# Patient Record
Sex: Female | Born: 1935 | Race: Black or African American | Hispanic: No | Smoking: Never smoker
Health system: Southern US, Community
[De-identification: ages and names within clinical notes are randomized; demographics above are authoritative.]

## PROBLEM LIST (undated history)

## (undated) DIAGNOSIS — I509 Heart failure, unspecified: Secondary | ICD-10-CM

## (undated) DIAGNOSIS — Z9889 Other specified postprocedural states: Secondary | ICD-10-CM

## (undated) DIAGNOSIS — I639 Cerebral infarction, unspecified: Secondary | ICD-10-CM

## (undated) DIAGNOSIS — I10 Essential (primary) hypertension: Secondary | ICD-10-CM

## (undated) HISTORY — PX: EP IMPLANTABLE DEVICE: SHX172B

---

## 2014-07-21 ENCOUNTER — Inpatient Hospital Stay (HOSPITAL_COMMUNITY): Payer: Medicare Other

## 2014-07-21 ENCOUNTER — Encounter (HOSPITAL_COMMUNITY): Payer: Self-pay

## 2014-07-21 ENCOUNTER — Inpatient Hospital Stay (HOSPITAL_COMMUNITY)
Admission: EM | Admit: 2014-07-21 | Discharge: 2014-07-23 | DRG: 603 | Disposition: A | Discharge status: Home Health | Payer: Medicare Other | Attending: Internal Medicine | Admitting: Internal Medicine

## 2014-07-21 DIAGNOSIS — M109 Gout, unspecified: Secondary | ICD-10-CM | POA: Diagnosis present

## 2014-07-21 DIAGNOSIS — J449 Chronic obstructive pulmonary disease, unspecified: Secondary | ICD-10-CM | POA: Diagnosis present

## 2014-07-21 DIAGNOSIS — R7989 Other specified abnormal findings of blood chemistry: Secondary | ICD-10-CM | POA: Diagnosis present

## 2014-07-21 DIAGNOSIS — I429 Cardiomyopathy, unspecified: Secondary | ICD-10-CM | POA: Diagnosis present

## 2014-07-21 DIAGNOSIS — F17210 Nicotine dependence, cigarettes, uncomplicated: Secondary | ICD-10-CM | POA: Diagnosis present

## 2014-07-21 DIAGNOSIS — L0390 Cellulitis, unspecified: Secondary | ICD-10-CM | POA: Diagnosis present

## 2014-07-21 DIAGNOSIS — N183 Chronic kidney disease, stage 3 (moderate): Secondary | ICD-10-CM | POA: Diagnosis present

## 2014-07-21 DIAGNOSIS — Z8673 Personal history of transient ischemic attack (TIA), and cerebral infarction without residual deficits: Secondary | ICD-10-CM

## 2014-07-21 DIAGNOSIS — I509 Heart failure, unspecified: Secondary | ICD-10-CM | POA: Diagnosis present

## 2014-07-21 DIAGNOSIS — I129 Hypertensive chronic kidney disease with stage 1 through stage 4 chronic kidney disease, or unspecified chronic kidney disease: Secondary | ICD-10-CM | POA: Diagnosis present

## 2014-07-21 DIAGNOSIS — I248 Other forms of acute ischemic heart disease: Secondary | ICD-10-CM | POA: Diagnosis present

## 2014-07-21 DIAGNOSIS — Z7982 Long term (current) use of aspirin: Secondary | ICD-10-CM

## 2014-07-21 DIAGNOSIS — E440 Moderate protein-calorie malnutrition: Secondary | ICD-10-CM | POA: Diagnosis present

## 2014-07-21 DIAGNOSIS — R748 Abnormal levels of other serum enzymes: Secondary | ICD-10-CM

## 2014-07-21 DIAGNOSIS — L03116 Cellulitis of left lower limb: Secondary | ICD-10-CM | POA: Diagnosis not present

## 2014-07-21 DIAGNOSIS — Z95810 Presence of automatic (implantable) cardiac defibrillator: Secondary | ICD-10-CM | POA: Diagnosis not present

## 2014-07-21 DIAGNOSIS — E876 Hypokalemia: Secondary | ICD-10-CM | POA: Diagnosis not present

## 2014-07-21 DIAGNOSIS — M25562 Pain in left knee: Secondary | ICD-10-CM

## 2014-07-21 DIAGNOSIS — R609 Edema, unspecified: Secondary | ICD-10-CM | POA: Diagnosis not present

## 2014-07-21 DIAGNOSIS — Z880 Allergy status to penicillin: Secondary | ICD-10-CM

## 2014-07-21 HISTORY — DX: Heart failure, unspecified: I50.9

## 2014-07-21 HISTORY — DX: Essential (primary) hypertension: I10

## 2014-07-21 LAB — CBC WITH DIFFERENTIAL/PLATELET
BASOS ABS: 0 10*3/uL (ref 0.0–0.1)
Basophils Relative: 0 % (ref 0–1)
Eosinophils Absolute: 0.1 10*3/uL (ref 0.0–0.7)
Eosinophils Relative: 1 % (ref 0–5)
HCT: 32.6 % — ABNORMAL LOW (ref 36.0–46.0)
Hemoglobin: 10.5 g/dL — ABNORMAL LOW (ref 12.0–15.0)
LYMPHS PCT: 29 % (ref 12–46)
Lymphs Abs: 1.7 10*3/uL (ref 0.7–4.0)
MCH: 23.8 pg — ABNORMAL LOW (ref 26.0–34.0)
MCHC: 32.2 g/dL (ref 30.0–36.0)
MCV: 73.9 fL — AB (ref 78.0–100.0)
Monocytes Absolute: 0.8 10*3/uL (ref 0.1–1.0)
Monocytes Relative: 14 % — ABNORMAL HIGH (ref 3–12)
Neutro Abs: 3.1 10*3/uL (ref 1.7–7.7)
Neutrophils Relative %: 56 % (ref 43–77)
PLATELETS: 260 10*3/uL (ref 150–400)
RBC: 4.41 MIL/uL (ref 3.87–5.11)
RDW: 15.4 % (ref 11.5–15.5)
WBC: 5.6 10*3/uL (ref 4.0–10.5)

## 2014-07-21 LAB — HEPATIC FUNCTION PANEL
ALT: 36 U/L — ABNORMAL HIGH (ref 0–35)
AST: 129 U/L — AB (ref 0–37)
Albumin: 2.7 g/dL — ABNORMAL LOW (ref 3.5–5.2)
Alkaline Phosphatase: 53 U/L (ref 39–117)
BILIRUBIN DIRECT: 0.4 mg/dL (ref 0.0–0.5)
BILIRUBIN INDIRECT: 0.8 mg/dL (ref 0.3–0.9)
BILIRUBIN TOTAL: 1.2 mg/dL (ref 0.3–1.2)
Total Protein: 6.3 g/dL (ref 6.0–8.3)

## 2014-07-21 LAB — I-STAT TROPONIN, ED: Troponin i, poc: 0.21 ng/mL (ref 0.00–0.08)

## 2014-07-21 LAB — CBC
HEMATOCRIT: 31.4 % — AB (ref 36.0–46.0)
Hemoglobin: 9.9 g/dL — ABNORMAL LOW (ref 12.0–15.0)
MCH: 23.3 pg — AB (ref 26.0–34.0)
MCHC: 31.5 g/dL (ref 30.0–36.0)
MCV: 74.1 fL — AB (ref 78.0–100.0)
Platelets: 239 10*3/uL (ref 150–400)
RBC: 4.24 MIL/uL (ref 3.87–5.11)
RDW: 15.5 % (ref 11.5–15.5)
WBC: 5.4 10*3/uL (ref 4.0–10.5)

## 2014-07-21 LAB — URINALYSIS, ROUTINE W REFLEX MICROSCOPIC
BILIRUBIN URINE: NEGATIVE
GLUCOSE, UA: NEGATIVE mg/dL
KETONES UR: NEGATIVE mg/dL
Leukocytes, UA: NEGATIVE
Nitrite: NEGATIVE
PROTEIN: NEGATIVE mg/dL
Specific Gravity, Urine: 1.006 (ref 1.005–1.030)
Urobilinogen, UA: 0.2 mg/dL (ref 0.0–1.0)
pH: 6.5 (ref 5.0–8.0)

## 2014-07-21 LAB — BASIC METABOLIC PANEL
ANION GAP: 8 (ref 5–15)
BUN: 18 mg/dL (ref 6–23)
CHLORIDE: 100 mmol/L (ref 96–112)
CO2: 28 mmol/L (ref 19–32)
Calcium: 8.9 mg/dL (ref 8.4–10.5)
Creatinine, Ser: 1.78 mg/dL — ABNORMAL HIGH (ref 0.50–1.10)
GFR calc Af Amer: 30 mL/min — ABNORMAL LOW (ref >90)
GFR calc non Af Amer: 26 mL/min — ABNORMAL LOW (ref >90)
Glucose, Bld: 107 mg/dL — ABNORMAL HIGH (ref 70–99)
POTASSIUM: 3 mmol/L — AB (ref 3.5–5.1)
Sodium: 136 mmol/L (ref 135–145)

## 2014-07-21 LAB — CREATININE, SERUM
CREATININE: 1.79 mg/dL — AB (ref 0.50–1.10)
GFR calc Af Amer: 29 mL/min — ABNORMAL LOW (ref >90)
GFR, EST NON AFRICAN AMERICAN: 25 mL/min — AB (ref >90)

## 2014-07-21 LAB — TSH: TSH: 2.253 u[IU]/mL (ref 0.350–4.500)

## 2014-07-21 LAB — URINE MICROSCOPIC-ADD ON

## 2014-07-21 LAB — MAGNESIUM: MAGNESIUM: 1.5 mg/dL (ref 1.5–2.5)

## 2014-07-21 LAB — TROPONIN I: Troponin I: 0.42 ng/mL — ABNORMAL HIGH (ref <0.031)

## 2014-07-21 LAB — BRAIN NATRIURETIC PEPTIDE
B Natriuretic Peptide: 402.1 pg/mL — ABNORMAL HIGH (ref 0.0–100.0)
B Natriuretic Peptide: 355.3 pg/mL — ABNORMAL HIGH (ref 0.0–100.0)

## 2014-07-21 MED ORDER — ACETAMINOPHEN 650 MG RE SUPP: 650.0000 mg | Freq: Four times a day (QID) | RECTAL | Status: DC | PRN

## 2014-07-21 MED ORDER — POTASSIUM CHLORIDE ER 10 MEQ PO TBCR
40.0000 meq | EXTENDED_RELEASE_TABLET | Freq: Once | ORAL | Status: AC
  Administered 2014-07-21: 40 meq via ORAL
  Filled 2014-07-21: qty 4

## 2014-07-21 MED ORDER — BUDESONIDE-FORMOTEROL FUMARATE 160-4.5 MCG/ACT IN AERO
2.0000 | INHALATION_SPRAY | Freq: Two times a day (BID) | RESPIRATORY_TRACT | Status: DC
  Administered 2014-07-22 – 2014-07-23 (×3): 2 via RESPIRATORY_TRACT
  Filled 2014-07-21 (×2): qty 6

## 2014-07-21 MED ORDER — LEVALBUTEROL HCL 0.63 MG/3ML IN NEBU
0.6300 mg | INHALATION_SOLUTION | Freq: Four times a day (QID) | RESPIRATORY_TRACT | Status: DC
  Administered 2014-07-21 – 2014-07-22 (×2): 0.63 mg via RESPIRATORY_TRACT
  Filled 2014-07-21 (×2): qty 3

## 2014-07-21 MED ORDER — FUROSEMIDE 10 MG/ML IJ SOLN
40.0000 mg | Freq: Once | INTRAMUSCULAR | Status: AC
  Administered 2014-07-21: 40 mg via INTRAVENOUS
  Filled 2014-07-21: qty 4

## 2014-07-21 MED ORDER — METHYLPREDNISOLONE SODIUM SUCC 125 MG IJ SOLR
60.0000 mg | Freq: Every day | INTRAMUSCULAR | Status: DC
  Administered 2014-07-21 – 2014-07-23 (×3): 60 mg via INTRAVENOUS
  Filled 2014-07-21: qty 0.96
  Filled 2014-07-21: qty 2
  Filled 2014-07-21: qty 0.96

## 2014-07-21 MED ORDER — ONDANSETRON HCL 4 MG/2ML IJ SOLN: 4.0000 mg | Freq: Four times a day (QID) | INTRAMUSCULAR | Status: DC | PRN

## 2014-07-21 MED ORDER — ASPIRIN EC 81 MG PO TBEC
81.0000 mg | DELAYED_RELEASE_TABLET | Freq: Every day | ORAL | Status: DC
  Administered 2014-07-22 – 2014-07-23 (×2): 81 mg via ORAL
  Filled 2014-07-21 (×3): qty 1

## 2014-07-21 MED ORDER — SODIUM CHLORIDE 0.9 % IV SOLN: 250.0000 mL | INTRAVENOUS | Status: DC | PRN

## 2014-07-21 MED ORDER — CARVEDILOL 25 MG PO TABS
25.0000 mg | ORAL_TABLET | Freq: Two times a day (BID) | ORAL | Status: DC
  Administered 2014-07-22 – 2014-07-23 (×3): 25 mg via ORAL
  Filled 2014-07-21 (×6): qty 1

## 2014-07-21 MED ORDER — ONDANSETRON HCL 4 MG PO TABS: 4.0000 mg | ORAL_TABLET | Freq: Four times a day (QID) | ORAL | Status: DC | PRN

## 2014-07-21 MED ORDER — ISOSORBIDE MONONITRATE ER 30 MG PO TB24
30.0000 mg | ORAL_TABLET | Freq: Every day | ORAL | Status: DC
  Administered 2014-07-22 – 2014-07-23 (×2): 30 mg via ORAL
  Filled 2014-07-21 (×3): qty 1

## 2014-07-21 MED ORDER — SODIUM CHLORIDE 0.9 % IJ SOLN
3.0000 mL | Freq: Two times a day (BID) | INTRAMUSCULAR | Status: DC
  Administered 2014-07-22 (×2): 3 mL via INTRAVENOUS

## 2014-07-21 MED ORDER — VANCOMYCIN HCL 500 MG IV SOLR
500.0000 mg | INTRAVENOUS | Status: DC
  Administered 2014-07-22: 500 mg via INTRAVENOUS
  Filled 2014-07-21 (×2): qty 500

## 2014-07-21 MED ORDER — MAGNESIUM OXIDE 400 (241.3 MG) MG PO TABS
400.0000 mg | ORAL_TABLET | Freq: Every day | ORAL | Status: DC
  Administered 2014-07-21 – 2014-07-23 (×3): 400 mg via ORAL
  Filled 2014-07-21 (×3): qty 1

## 2014-07-21 MED ORDER — SODIUM CHLORIDE 0.9 % IJ SOLN: 3.0000 mL | INTRAMUSCULAR | Status: DC | PRN

## 2014-07-21 MED ORDER — ACETAMINOPHEN 325 MG PO TABS
650.0000 mg | ORAL_TABLET | Freq: Four times a day (QID) | ORAL | Status: DC | PRN
  Administered 2014-07-22 (×3): 650 mg via ORAL
  Filled 2014-07-21 (×3): qty 2

## 2014-07-21 MED ORDER — CLINDAMYCIN PHOSPHATE 600 MG/50ML IV SOLN
600.0000 mg | Freq: Once | INTRAVENOUS | Status: AC
Start: 2014-07-21 — End: 2014-07-21
  Administered 2014-07-21: 600 mg via INTRAVENOUS
  Filled 2014-07-21: qty 50

## 2014-07-21 MED ORDER — POTASSIUM CHLORIDE ER 10 MEQ PO TBCR
40.0000 meq | EXTENDED_RELEASE_TABLET | Freq: Once | ORAL | Status: AC
  Administered 2014-07-21: 40 meq via ORAL

## 2014-07-21 MED ORDER — ENOXAPARIN SODIUM 30 MG/0.3ML ~~LOC~~ SOLN
30.0000 mg | SUBCUTANEOUS | Status: DC
  Administered 2014-07-21 – 2014-07-22 (×2): 30 mg via SUBCUTANEOUS
  Filled 2014-07-21 (×3): qty 0.3

## 2014-07-21 MED ORDER — IPRATROPIUM BROMIDE 0.02 % IN SOLN
0.5000 mg | Freq: Four times a day (QID) | RESPIRATORY_TRACT | Status: DC
  Administered 2014-07-21 – 2014-07-22 (×2): 0.5 mg via RESPIRATORY_TRACT
  Filled 2014-07-21 (×2): qty 2.5

## 2014-07-21 MED ORDER — VANCOMYCIN HCL IN DEXTROSE 1-5 GM/200ML-% IV SOLN
1000.0000 mg | Freq: Once | INTRAVENOUS | Status: AC
  Administered 2014-07-21: 1000 mg via INTRAVENOUS
  Filled 2014-07-21: qty 200

## 2014-07-21 MED ORDER — SODIUM CHLORIDE 0.9 % IV SOLN: INTRAVENOUS | Status: DC

## 2014-07-21 MED ORDER — SODIUM CHLORIDE 0.9 % IJ SOLN: 3.0000 mL | Freq: Two times a day (BID) | INTRAMUSCULAR | Status: DC

## 2014-07-21 NOTE — ED Notes (Signed)
PA at bedside.

## 2014-07-21 NOTE — ED Notes (Signed)
Gentry, MD notified of abnormal lab test results 

## 2014-07-21 NOTE — ED Notes (Signed)
Second IV team nurse at bedside.  

## 2014-07-21 NOTE — ED Notes (Signed)
Pt c/o L hand and bilateral leg swelling; family member at bedside reports patient has been out of medications x 1.5 week since moving to [place]. Pt does not have a pcp in this area yet.

## 2014-07-21 NOTE — Progress Notes (Signed)
ANTIBIOTIC CONSULT NOTE - INITIAL  Pharmacy Consult for Vancomycin Indication: Cellulitis  Allergies  Allergen Reactions  . Penicillins Hives    Patient Measurements: Height: 5\' 9"  (175.3 cm) Weight: 150 lb (68.04 kg) IBW/kg (Calculated) : 66.2  Vital Signs: Temp: 97.3 F (36.3 C) (02/19 1250) Temp Source: Oral (02/19 1250) BP: 182/83 mmHg (02/19 1715) Pulse Rate: 116 (02/19 1715) Intake/Output from previous day:   Intake/Output from this shift:    Labs:  Recent Labs  07/21/14 1417  WBC 5.6  HGB 10.5*  PLT 260  CREATININE 1.78*   Estimated Creatinine Clearance: 25.9 mL/min (by C-G formula based on Cr of 1.78). No results for input(s): VANCOTROUGH, VANCOPEAK, VANCORANDOM, GENTTROUGH, GENTPEAK, GENTRANDOM, TOBRATROUGH, TOBRAPEAK, TOBRARND, AMIKACINPEAK, AMIKACINTROU, AMIKACIN in the last 72 hours.   Microbiology: No results found for this or any previous visit (from the past 720 hour(s)).  Medical History: Past Medical History  Diagnosis Date  . CHF (congestive heart failure)   . Hypertension     Medications:   (Not in a hospital admission)   Assessment: 81 yo F presents on 2/19 with leg swelling and pain. PMH includes history of stroke, CHF, HTN, and COPD. Pharmacy to dose vancomycin for cellulitis.Pt is afebrile and WBC wnl. SCr 1.78, CrCl ~4325ml/min.  Goal of Therapy:  Vancomycin trough level 10-15 mcg/ml  Resolution of infection  Plan:  Start vancomycin 500mg  IV Q24 Monitor renal function, clinical picture F/U abx deescalation and LOT  Katherine Chapman J 07/21/2014,5:41 PM

## 2014-07-21 NOTE — ED Notes (Signed)
IV attempt x2 without success.

## 2014-07-21 NOTE — ED Notes (Signed)
IV team at bedside 

## 2014-07-21 NOTE — ED Provider Notes (Signed)
CSN: [id number]     Arrival date & time 07/21/14  1238 History   First MD Initiated Contact with Patient 07/21/14 1336     Chief Complaint  Patient presents with  . Leg Swelling     (Consider location/radiation/quality/duration/timing/severity/associated sxs/prior Treatment) HPI   This is an 81 year old female who presents emergency Department with chief complaint of left leg swelling and pain. She has a past medical history of previous stroke, CHF, hypertension, COPD,. Patient is a current smoker. [place] is living with her son. He says she has a past history of gout and chronic peripheral edema. The patient was seen by a neurologist here in town and had her medications refilled. She was told to double up on her Lasix. On days when the swelling in her legs is bad. Her son states that he has been giving her Lasix for her leg pain. However, she has only decreased swelling in the right and has continued to have a unilateral left leg swelling and pain. He states he first noticed the difference about 2 days ago when he was trying to help her move the legs. She had significant pain in the left side. She denies a history of DVT. However, the patient seems to have some cognitive deficit and her history is unreliable. The patient also has chronic left hand swelling but denies any pain or unilateral weakness. She is able to ambulate with a walker or a cane at home. Her son states she has been afebrile and does not appear to be short of breath.  Past Medical History  Diagnosis Date  . CHF (congestive heart failure)   . Hypertension    Past Surgical History  Procedure Laterality Date  . Ep implantable device     No family history on file. History  Substance Use Topics  . Smoking status: Current Some Day Smoker  . Smokeless tobacco: Not on file  . Alcohol Use: No   OB History    No data available     Review of Systems  Unable to perform ROS: Dementia       Allergies  Penicillins  Home Medications   Prior to Admission medications   Not on File   BP 131/78 mmHg  Pulse 75  Temp(Src) 97.3 F (36.3 C) (Oral)  Resp 11  SpO2 100% Physical Exam  Constitutional: She is oriented to person, place, and time. She appears well-developed and well-nourished. No distress.  Patient sitting upright in bed, leaning to the left side.  HENT:  Head: Normocephalic and atraumatic.  Large well-healed scar on the right cheek  Eyes: Conjunctivae are normal. No scleral icterus.  Neck: Normal range of motion. No JVD present.  Cardiovascular: Normal rate, regular rhythm and normal heart sounds.  Exam reveals no gallop and no friction rub.   No murmur heard. BL LOWER extremity edema. Left greater than R.. Warm, red, ttp. Pulses present on doppler.  Pulmonary/Chest: Effort normal and breath sounds normal. No respiratory distress.  Abdominal: Soft. Bowel sounds are normal. She exhibits no distension and no mass. There is no tenderness. There is no guarding.  Neurological: She is alert and oriented to person, place, and time.  Skin: Skin is warm and dry. She is not diaphoretic.  Nursing note and vitals reviewed.   ED Course  Procedures (including critical care time) Labs Review Labs Reviewed - No data to display  Imaging Review No results found.   EKG Interpretation   Date/Time:  Friday  July 21 2014 15:40:59 EST Ventricular Rate:  73 PR Interval:  143 QRS Duration: 89 QT Interval:  503 QTC Calculation: 554 R Axis:   13 Text Interpretation:  Sinus rhythm Atrial premature complex Abnrm T,  consider ischemia, anterolateral lds Prolonged QT interval No old tracing  to compare Confirmed by Mirian Mo [phone]) on 07/21/2014 4:59:40 PM      MDM   Final diagnoses:  Cellulitis of left lower extremity  Elevated serum creatinine    2:52 PM BP 145/75 mmHg  Pulse 76  Temp(Src) 97.3 F (36.3 C) (Oral)  Resp 13  SpO2 100%; Patient  with UL leg swelling. I do not have access to previous medical records and the patient has an extensive medical history. Differential includes dvt, cellulitis , chf, renal insufficiency, . EKG with abnormla T waves in anterolateral leads,  I have discussed the case with Dr, Susie Cassette who will admit and assume care of the patient  Pt stable in ED with no significant deterioration in condition.     5:01 PM Blood pressure 142/67, pulse 77, temperature 97.3 F (36.3 C), temperature source Oral, resp. rate 13, SpO2 100 %.  Patient with elevated serum creatinine.  Unknown baseline. Apparent R leg cellulits and negative DVT study. troponin si elevated but no c/o cp in the setting of her kidney fx and chronic illnesses I doubt MI as the cause  Arthor Captain, PA-C 07/24/14 1153  Harrold Donath R. Rubin Payor, MD 07/25/14 [phone]

## 2014-07-21 NOTE — Progress Notes (Signed)
*  PRELIMINARY RESULTS* Vascular Ultrasound Left lower extremity venous duplex has been completed.  Preliminary findings: no evidence of DVT.   Farrel DemarkJill Eunice, RDMS, RVT  07/21/2014, 2:54 PM

## 2014-07-21 NOTE — H&P (Signed)
[facility] History and Physical  Persais Ethridge ZOX:[id number] DOB: April 12, 1931 DOA: 07/21/2014  Referring physician:  PCP: No primary care provider on file.   Chief Complaint:  Generalized swelling  HPI:  81 year old female who apparently has an ICD, history of CHF, CVA, hypertension, COPD, active smoker, recently moved to [place] from [place], followed by a nephrologist and her PCP, presents to the ED with generalized swelling, mainly in her left upper extremity and left lower extremity. Patient apparently ran out of 4 different medications but has been taking her Lasix. Despite taking the Lasix the patient has had worsening swelling and increasing pain in bilateral lower extremities. The left lower extremities warm to touch. Patient has difficulty standing and weightbearing.No apparent fever at home. She denies falling, denies any trauma. The patient also has had swelling of the left upper extremity appears to have some residual weakness from her previous stroke on the left side. Patient is normally able to ambulate with a walker or a cane. She denies any chest pain any shortness of breath. Patient has a history of gout but ran out of her allopurinol. She has a history of hypertension and ran out of her Coreg,imdur, zestoreic . Workup in the ED shows that she is afebrile hemodynamically stable , troponin 0.4, proBNP 355, creatinine 1.79, GFR 25, AST 129, ALT 36, WBC count 5.4      Review of Systems: negative for the following  Constitutional: Denies fever, chills, diaphoresis, appetite change and fatigue.  HEENT: Denies photophobia, eye pain, redness, hearing loss, ear pain, congestion, sore throat, rhinorrhea, sneezing, mouth sores, trouble swallowing, neck pain, neck stiffness and tinnitus.  Respiratory: Denies SOB, DOE, cough, chest tightness, and wheezing.  Cardiovascular: Denies chest pain, palpitations and leg swelling.  Gastrointestinal: Denies nausea, vomiting, abdominal pain,  diarrhea, constipation, blood in stool and abdominal distention.  Genitourinary: Denies dysuria, urgency, frequency, hematuria, flank pain and difficulty urinating.  Musculoskeletal: Denies myalgias, back pain, left upper and left lower extremity swelling, arthralgias and gait problem.  Skin: Denies pallor, rash and wound.  Neurological: Denies dizziness, seizures, syncope, weakness, light-headedness, numbness and headaches.  Hematological: Denies adenopathy. Easy bruising, personal or family bleeding history  Psychiatric/Behavioral: Denies suicidal ideation, mood changes, confusion, nervousness, sleep disturbance and agitation       Past Medical History  Diagnosis Date  . CHF (congestive heart failure)   . Hypertension      Past Surgical History  Procedure Laterality Date  . Ep implantable device        Social History:  reports that she has been smoking.  She does not have any smokeless tobacco history on file. She reports that she does not drink alcohol or use illicit drugs.    Allergies  Allergen Reactions  . Penicillins Hives        FAMILY HISTORY  When questioned  Directly-patient reports  No family history of HTN, CVA ,DIABETES, TB, Cancer CAD, Bleeding Disorders, Sickle Cell, diabetes, anemia, asthma,   Prior to Admission medications   Medication Sig Start Date End Date Taking? Authorizing Provider  allopurinol (ZYLOPRIM) 100 MG tablet Take 100 mg by mouth daily.   Yes Historical Provider, MD  aspirin EC 81 MG tablet Take 81 mg by mouth daily.   Yes Historical Provider, MD  budesonide-formoterol (SYMBICORT) 160-4.5 MCG/ACT inhaler Inhale 2 puffs into the lungs 2 (two) times daily.   Yes Historical Provider, MD  carvedilol (COREG) 25 MG tablet Take 25 mg by mouth 2 (two) times daily with a  meal.   Yes Historical Provider, MD  furosemide (LASIX) 40 MG tablet Take 40 mg by mouth daily.   Yes Historical Provider, MD  isosorbide mononitrate (IMDUR) 30 MG 24 hr tablet  Take 30 mg by mouth daily.   Yes Historical Provider, MD  lisinopril-hydrochlorothiazide (PRINZIDE,ZESTORETIC) 20-12.5 MG per tablet Take 1 tablet by mouth daily.   Yes Historical Provider, MD     Physical Exam: Filed Vitals:   07/21/14 1715 07/21/14 1816 07/21/14 1818 07/21/14 1821  BP: 182/83   164/79  Pulse: 116   112  Temp:   97.2 F (36.2 C)   TempSrc:   Oral   Resp: 11     Height: 5\' 9"  (1.753 m)     Weight: 68.04 kg (150 lb) 65.953 kg (145 lb 6.4 oz)    SpO2: 100%   100%     Constitutional: Vital signs reviewed. Patient is a well-developed and well-nourished in no acute distress and cooperative with exam. Alert and oriented x3.  Head: Normocephalic and atraumatic  Ear: TM normal bilaterally  Mouth: no erythema or exudates, MMM Large well-healed scar on the right cheek  Eyes: PERRL, EOMI, conjunctivae normal, No scleral icterus.  Neck: Supple, Trachea midline normal ROM, No JVD, mass, thyromegaly, or carotid bruit present.  Cardiovascular: RRR, S1 normal, S2 normal, no MRG, pulses symmetric and intact bilaterally  Pulmonary/Chest: CTAB, no wheezes, rales, or rhonchi  Abdominal: Soft. Non-tender, non-distended, bowel sounds are normal, no masses, organomegaly, or guarding present.  GU: no CVA tenderness Musculoskeletal: No joint deformities, erythema, or stiffness, ROM full and no nontender BL LOWER extremity edema. Left greater than R.. Warm, red, ttp. Pulses present on doppler. Hematology: no cervical, inginal, or axillary adenopathy.  Neurological: A&O x3, Strenght is normal and symmetric bilaterally, cranial nerve II-XII are grossly intact, no focal motor deficit, sensory intact to light touch bilaterally.  Skin: Warm, dry and intact. No rash, cyanosis, or clubbing.  Psychiatric: Normal mood and affect. speech and behavior is normal. Judgment and thought content normal. Cognition and memory are normal.       Labs on Admission:    Basic Metabolic Panel:  Recent  Labs Lab 07/21/14 1417 07/21/14 1703  NA 136  --   K 3.0*  --   CL 100  --   CO2 28  --   GLUCOSE 107*  --   BUN 18  --   CREATININE 1.78* 1.79*  CALCIUM 8.9  --   MG  --  1.5   Liver Function Tests:  Recent Labs Lab 07/21/14 1703  AST 129*  ALT 36*  ALKPHOS 53  BILITOT 1.2  PROT 6.3  ALBUMIN 2.7*   No results for input(s): LIPASE, AMYLASE in the last 168 hours. No results for input(s): AMMONIA in the last 168 hours. CBC:  Recent Labs Lab 07/21/14 1417 07/21/14 1727  WBC 5.6 5.4  NEUTROABS 3.1  --   HGB 10.5* 9.9*  HCT 32.6* 31.4*  MCV 73.9* 74.1*  PLT 260 239   Cardiac Enzymes:  Recent Labs Lab 07/21/14 1703  TROPONINI 0.42*    BNP (last 3 results)  Recent Labs  07/21/14 1417 07/21/14 1727  BNP 402.1* 355.3*    ProBNP (last 3 results) No results for input(s): PROBNP in the last 8760 hours.     CBG: No results for input(s): GLUCAP in the last 168 hours.  Radiological Exams on Admission: No results found.  EKG: Independently reviewed.    Assessment/Plan Active Problems:  Cellulitis  Generalized swelling Patient has been taking her Lasix and appears euvolemic Worsening edema and warmth of the left upper left lower extremity could be secondary to gout It also appears that the patient has some dependent edema because of left upper extremity weakness vs hypoalbuminemia Continue Lasix  Cellulitis of the left lower extremity/swelling of left upper extremity Could be secondary to acute gout exacerbation Check uric acid Start the patient on Solu-Medrol Given chronic kidney disease would not use colchicine Start the patient on vancomycin Obtain plain films of the left upper/ left lower extremity I do not suspect septic arthritis Venous Doppler of the lower extremities negative We'll obtain a venous Doppler of bilateral upper extremities  CKD, stage III Baseline creatinine unknown Continue Lasix as the patient appears  euvolemic  Hypokalemia Replete  Hypomagnesemia Replete  Abnormal troponin Continue aspirin, Coreg 2-D echo, if the troponin is trending up will have cardiology evaluate in the morning Patient is chest pain-free , has an ICD in place most likely secondary to cardiomyopathy  Moderate protein calorie malnutrition Nutrition consult  Code Status:   full Family Communication: bedside Disposition Plan: admit   Time spent: 70 mins   [facility] Pager [phone]  If 7PM-7AM, please contact night-coverage [email or web address] Password [facility] 07/21/2014, 6:30 PM

## 2014-07-21 NOTE — ED Notes (Signed)
One set of blood cultures obtained prior to antibiotics; unable to obtain second set

## 2014-07-21 NOTE — ED Notes (Signed)
IV team unsuccessful at IV start

## 2014-07-21 NOTE — ED Notes (Signed)
Pt. Is from home. Complaint of L leg edema and L hand edema. Pt. Has some swelling to R leg as well. Pt. States L leg is painful to touch, has not been able to ambulate today. PTAR reports leg is warm to touch. Pt. Has hx CHF with ICD placement. Pt. Is recently relocated to area from [place] and has no had medications for unknown time. Pt. Takes lasix, lisinopril, HCTZ, carvedilol, ASA, and isosorbide.

## 2014-07-22 DIAGNOSIS — M7989 Other specified soft tissue disorders: Secondary | ICD-10-CM

## 2014-07-22 LAB — TROPONIN I
TROPONIN I: 0.33 ng/mL — AB (ref <0.031)
TROPONIN I: 0.27 ng/mL — AB (ref <0.031)

## 2014-07-22 LAB — URIC ACID: Uric Acid, Serum: 6.7 mg/dL (ref 2.4–7.0)

## 2014-07-22 LAB — COMPREHENSIVE METABOLIC PANEL
ALT: 32 U/L (ref 0–35)
ANION GAP: 9 (ref 5–15)
AST: 102 U/L — ABNORMAL HIGH (ref 0–37)
Albumin: 2.8 g/dL — ABNORMAL LOW (ref 3.5–5.2)
Alkaline Phosphatase: 59 U/L (ref 39–117)
BUN: 20 mg/dL (ref 6–23)
CO2: 26 mmol/L (ref 19–32)
Calcium: 9.2 mg/dL (ref 8.4–10.5)
Chloride: 103 mmol/L (ref 96–112)
Creatinine, Ser: 1.85 mg/dL — ABNORMAL HIGH (ref 0.50–1.10)
GFR calc Af Amer: 28 mL/min — ABNORMAL LOW (ref >90)
GFR calc non Af Amer: 24 mL/min — ABNORMAL LOW (ref >90)
Glucose, Bld: 140 mg/dL — ABNORMAL HIGH (ref 70–99)
Potassium: 3.5 mmol/L (ref 3.5–5.1)
Sodium: 138 mmol/L (ref 135–145)
TOTAL PROTEIN: 7 g/dL (ref 6.0–8.3)
Total Bilirubin: 0.4 mg/dL (ref 0.3–1.2)

## 2014-07-22 LAB — CBC
HCT: 33 % — ABNORMAL LOW (ref 36.0–46.0)
Hemoglobin: 10.2 g/dL — ABNORMAL LOW (ref 12.0–15.0)
MCH: 22.9 pg — ABNORMAL LOW (ref 26.0–34.0)
MCHC: 30.9 g/dL (ref 30.0–36.0)
MCV: 74 fL — ABNORMAL LOW (ref 78.0–100.0)
PLATELETS: 263 10*3/uL (ref 150–400)
RBC: 4.46 MIL/uL (ref 3.87–5.11)
RDW: 15.5 % (ref 11.5–15.5)
WBC: 4 10*3/uL (ref 4.0–10.5)

## 2014-07-22 LAB — HEMOGLOBIN A1C
Hgb A1c MFr Bld: 6 % — ABNORMAL HIGH (ref 4.8–5.6)
Mean Plasma Glucose: 126 mg/dL

## 2014-07-22 MED ORDER — LEVALBUTEROL HCL 0.63 MG/3ML IN NEBU: 0.6300 mg | INHALATION_SOLUTION | Freq: Four times a day (QID) | RESPIRATORY_TRACT | Status: DC | PRN

## 2014-07-22 MED ORDER — CETYLPYRIDINIUM CHLORIDE 0.05 % MT LIQD
7.0000 mL | Freq: Two times a day (BID) | OROMUCOSAL | Status: DC
  Administered 2014-07-22 (×2): 7 mL via OROMUCOSAL

## 2014-07-22 MED ORDER — IPRATROPIUM BROMIDE 0.02 % IN SOLN: 0.5000 mg | Freq: Four times a day (QID) | RESPIRATORY_TRACT | Status: DC | PRN

## 2014-07-22 NOTE — Progress Notes (Signed)
Katherine Chapman notified of pt troponin 0.33. Previous 0.42.

## 2014-07-22 NOTE — Progress Notes (Signed)
[facility] PROGRESS NOTE  Mozella Rexrode ZOX:[id number] DOB: 13-Aug-1930 DOA: 07/21/2014 PCP: No primary care provider on file.  Assessment/Plan: Active Problems:   Cellulitis   Generalized swelling Continue Lasix on a daily basis, Worsening edema and warmth of the left upper left lower extremity could be secondary to gout It also appears that the patient has some dependent edema because of left upper extremity weakness vs hypoalbuminemia Nutrition consult for moderate protein calorie malnutrition  Abnormal troponin, likely demand ischemia 2-D echo pending to rule out wall motion abnormalities Continue Coreg, Isosorbide and aspirin ICD in place most likely secondary to cardiomyopathy and CK D   Cellulitis vs acute gout exacerbation of the left lower extremity/swelling of left upper extremity Uric acid 6.7 Continue Solu-Medrol 2 more days Given chronic kidney disease would not use colchicine Continue vancomycin Reviewed plain films of the left upper/ left lower extremity and they are negative I do not suspect septic arthritis Venous Doppler of the lower extremities negative Pending venous Doppler of bilateral upper extremities  CKD, stage III Baseline creatinine unknown Continue Lasix as the patient appears euvolemic Creatinine 1.78> 1.85  Hypokalemia Repleted  Hypomagnesemia Replete   Moderate protein calorie malnutrition Nutrition consult   Code Status: full Family Communication: family updated about patient's clinical progress Disposition Plan:  PT/OT eval  Brief narrative: 81 year old female who apparently has an ICD, history of CHF, CVA, hypertension, COPD, active smoker, recently moved to [place] from [place], followed by a nephrologist and her PCP, presents to the ED with generalized swelling, mainly in her left upper extremity and left lower extremity. Patient apparently ran out of 4 different medications but has been taking her Lasix. Despite taking the  Lasix the patient has had worsening swelling and increasing pain in bilateral lower extremities. The left lower extremities warm to touch. Patient has difficulty standing and weightbearing.No apparent fever at home. She denies falling, denies any trauma. The patient also has had swelling of the left upper extremity appears to have some residual weakness from her previous stroke on the left side. Patient is normally able to ambulate with a walker or a cane. She denies any chest pain any shortness of breath. Patient has a history of gout but ran out of her allopurinol. She has a history of hypertension and ran out of her Coreg,imdur, zestoreic . Workup in the ED shows that she is afebrile hemodynamically stable , troponin 0.4, proBNP 355, creatinine 1.79, GFR 25, AST 129, ALT 36, WBC count 5.4  Consultants:  None  Procedures:  None  Antibiotics: Vancomycin  HPI/Subjective: Patient feels a lot better, less pain in her left leg, less pain in her left upper extremity  Objective: Filed Vitals:   07/21/14 2045 07/21/14 2129 07/22/14 0245 07/22/14 0542  BP:  150/83  145/67  Pulse: 100 88 84 79  Temp:  97.7 F (36.5 C)  98 F (36.7 C)  TempSrc:  Oral  Oral  Resp: Height:      Weight:    66.316 kg (146 lb 3.2 oz)  SpO2: 100% 100% 100% 100%    Intake/Output Summary (Last 24 hours) at 07/22/14 1108 Last data filed at 07/22/14 0955  Gross per 24 hour  Intake    800 ml  Output      0 ml  Net    800 ml    Exam:  General: No acute respiratory distress Lungs: Clear to auscultation bilaterally without wheezes or crackles Cardiovascular: Regular rate and rhythm  without murmur gallop or rub normal S1 and S2 Abdomen: Nontender, nondistended, soft, bowel sounds positive, no rebound, no ascites, no appreciable mass Extremities: No significant cyanosis, clubbing, 1+ pitting edema bilateral lower extremities      Data Reviewed: Basic Metabolic Panel:  Recent Labs Lab  07/21/14 1417 07/21/14 1703 07/22/14 0540  NA 136  --  138  K 3.0*  --  3.5  CL 100  --  103  CO2 28  --  26  GLUCOSE 107*  --  140*  BUN 18  --  20  CREATININE 1.78* 1.79* 1.85*  CALCIUM 8.9  --  9.2  MG  --  1.5  --     Liver Function Tests:  Recent Labs Lab 07/21/14 1703 07/22/14 0540  AST 129* 102*  ALT 36* 32  ALKPHOS 53 59  BILITOT 1.2 0.4  PROT 6.3 7.0  ALBUMIN 2.7* 2.8*   No results for input(s): LIPASE, AMYLASE in the last 168 hours. No results for input(s): AMMONIA in the last 168 hours.  CBC:  Recent Labs Lab 07/21/14 1417 07/21/14 1727 07/22/14 0540  WBC 5.6 5.4 4.0  NEUTROABS 3.1  --   --   HGB 10.5* 9.9* 10.2*  HCT 32.6* 31.4* 33.0*  MCV 73.9* 74.1* 74.0*  PLT 260 239 263    Cardiac Enzymes:  Recent Labs Lab 07/21/14 1703 07/21/14 2322 07/22/14 0540  TROPONINI 0.42* 0.33* 0.27*   BNP (last 3 results)  Recent Labs  07/21/14 1417 07/21/14 1727  BNP 402.1* 355.3*    ProBNP (last 3 results) No results for input(s): PROBNP in the last 8760 hours.    CBG: No results for input(s): GLUCAP in the last 168 hours.  Recent Results (from the past 240 hour(s))  Culture, blood (routine x 2)     Status: None (Preliminary result)   Collection Time: 07/21/14  8:32 PM  Result Value Ref Range Status   Specimen Description BLOOD LEFT HAND  Final   Special Requests BOTTLES DRAWN AEROBIC ONLY 7CC  Final   Culture PENDING  Incomplete   Report Status PENDING  Incomplete     Studies: Dg Knee 1-2 Views Left  07/21/2014   CLINICAL DATA:  Nontraumatic left knee pain for 2 weeks  EXAM: LEFT KNEE - 1-2 VIEW  COMPARISON:  None.  FINDINGS: There is no evidence of fracture, dislocation, or joint effusion. There is no evidence of arthropathy or other focal bone abnormality. Soft tissues are unremarkable.  IMPRESSION: Negative.   Electronically Signed   By: Ellery Plunkaniel R Mitchell M.D.   On: 07/21/2014 23:05   Dg Hand Complete Left  07/21/2014   CLINICAL  DATA:  Nontraumatic left hand pain for 2 weeks  EXAM: LEFT HAND - COMPLETE 3+ VIEW  COMPARISON:  None.  FINDINGS: There is no evidence of fracture or dislocation. There is no focal bone abnormality. Moderate arthritic changes are present at the carpometacarpal articulations. There is a dorsal carpal boss. Soft tissues are unremarkable.  IMPRESSION: Moderate arthritic changes. Negative for fracture or other acute abnormality.   Electronically Signed   By: Ellery Plunkaniel R Mitchell M.D.   On: 07/21/2014 23:17    Scheduled Meds: . antiseptic oral rinse  7 mL Mouth Rinse BID  . aspirin EC  81 mg Oral Daily  . budesonide-formoterol  2 puff Inhalation BID  . carvedilol  25 mg Oral BID WC  . enoxaparin (LOVENOX) injection  30 mg Subcutaneous Q24H  . isosorbide mononitrate  30 mg Oral Daily  .  magnesium oxide  400 mg Oral Daily  . methylPREDNISolone (SOLU-MEDROL) injection  60 mg Intravenous Daily  . sodium chloride  3 mL Intravenous Q12H  . sodium chloride  3 mL Intravenous Q12H  . vancomycin  500 mg Intravenous Q24H   Continuous Infusions:   Active Problems:   Cellulitis    Time spent: 40 minutes   [facility]  [facility] Pager [phone]. If 7PM-7AM, please contact night-coverage at [email or web address], password [facility] 07/22/2014, 11:08 AM  LOS: 1 day

## 2014-07-22 NOTE — Evaluation (Signed)
Physical Therapy Evaluation Patient Details Name: Katherine Chapman MRN: [id number] DOB: 05/28/1931 Today's Date: 07/22/2014   History of Present Illness  81 y.o. female admitted with Generalized swelling, CKD, and elevated troponin.  Clinical Impression  Pt admitted with the above complications. Pt currently with functional limitations due to the deficits listed below (see PT Problem List). Ambulates very slowly up to 40 feet with min assist for walker control. States her son assists her with ADLs at baseline and that he works from home and can therefore provide 24 hour care at d/c. SpO2 95% and greater throughout therapy session on room air. Intermittent cough noted during therapy. Pt will benefit from skilled PT to increase their independence and safety with mobility to allow discharge to the venue listed below.       Follow Up Recommendations Home health PT;Supervision/Assistance - 24 hour    Equipment Recommendations  None recommended by PT    Recommendations for Other Services OT consult     Precautions / Restrictions Precautions Precautions: Fall Restrictions Weight Bearing Restrictions: No      Mobility  Bed Mobility               General bed mobility comments: In chair at start of therapy  Transfers Overall transfer level: Needs assistance Equipment used: Rolling walker (2 wheeled) Transfers: Sit to/from Stand Sit to Stand: Min assist         General transfer comment: Min assist for boost to stand from reclining chair. Cues for hand placement. Poor control with descent into chair. Slight posterior lean min assist to correct upon standing.  Ambulation/Gait Ambulation/Gait assistance: Min assist Ambulation Distance (Feet): 40 Feet Assistive device: Rolling walker (2 wheeled) Gait Pattern/deviations: Step-through pattern;Decreased step length - left;Decreased stance time - right;Shuffle;Trendelenburg;Trunk flexed;Narrow base of support Gait velocity: decreased    General Gait Details: Educated on safe DME use with a rolling walker. Needs min assist for walker control at times. No loss of balance needing assist for correction. Very slow and tends to shuffle left foot. Cues to improve foot clearance with little response.  Stairs            Wheelchair Mobility    Modified Rankin (Stroke Patients Only)       Balance Overall balance assessment: Needs assistance Sitting-balance support: No upper extremity supported;Feet supported Sitting balance-Leahy Scale: Fair   Postural control: Posterior lean Standing balance support: Bilateral upper extremity supported Standing balance-Leahy Scale: Poor Standing balance comment: Leans posteriorly upon standing.                             Pertinent Vitals/Pain Pain Assessment: 0-10 Pain Score: 0-No pain Pain Location: back Pain Intervention(s): Monitored during session    Home Living Family/patient expects to be discharged to:: Private residence Living Arrangements: Children Available Help at Discharge: Family;Available 24 hours/day Type of Home: House Home Access: Stairs to enter Entrance Stairs-Rails: Left Entrance Stairs-Number of Steps: 6 Home Layout: Two level;Bed/bath upstairs Home Equipment: Walker - 2 wheels;Cane - single point;Bedside commode;Tub bench      Prior Function Level of Independence: Needs assistance   Gait / Transfers Assistance Needed: Using cane and RW at times  ADL's / Homemaking Assistance Needed: Son assited with bath/dress. Per pt report.        Hand Dominance   Dominant Hand: Right    Extremity/Trunk Assessment   Upper Extremity Assessment: Defer to OT evaluation  Lower Extremity Assessment: Generalized weakness         Communication   Communication: No difficulties  Cognition Arousal/Alertness: Awake/alert Behavior During Therapy: WFL for tasks assessed/performed Overall Cognitive Status: No family/caregiver present  to determine baseline cognitive functioning (disoriented to place)                      General Comments General comments (skin integrity, edema, etc.): Pt believes she is in [place] but was a good historian during interview; RN confirmed that she lives at home with son who cares for her.    Exercises General Exercises - Lower Extremity Long Arc Quad: Strengthening;Both;10 reps;Seated Hip Flexion/Marching: Strengthening;Both;5 reps;Seated      Assessment/Plan    PT Assessment Patient needs continued PT services  PT Diagnosis Abnormality of gait;Generalized weakness;Difficulty walking   PT Problem List Decreased strength;Decreased activity tolerance;Decreased balance;Decreased mobility;Decreased cognition;Decreased knowledge of use of DME  PT Treatment Interventions DME instruction;Gait training;Stair training;Functional mobility training;Therapeutic activities;Therapeutic exercise;Neuromuscular re-education;Balance training;Cognitive remediation;Patient/family education   PT Goals (Current goals can be found in the Care Plan section) Acute Rehab PT Goals Patient Stated Goal: Go home with my son PT Goal Formulation: With patient Time For Goal Achievement: 08/05/14 Potential to Achieve Goals: Fair    Frequency Min 3X/week   Barriers to discharge        Co-evaluation               End of Session Equipment Utilized During Treatment: Gait belt Activity Tolerance: Patient tolerated treatment well Patient left: in chair;with call bell/phone within reach Nurse Communication: Mobility status         Time: 1610-9604 PT Time Calculation (min) (ACUTE ONLY): 23 min   Charges:   PT Evaluation $Initial PT Evaluation Tier I: 1 Procedure PT Treatments $Gait Training: 8-22 mins   PT G CodesBerton Mount 07/22/2014, 4:16 PM [facility] [place], [place] [phone]

## 2014-07-22 NOTE — Progress Notes (Signed)
Pt found sitting on floor in front of chair. States she slid down off chair

## 2014-07-22 NOTE — Plan of Care (Signed)
Problem: Food- and Nutrition-Related Knowledge Deficit (NB-1.1) Goal: Nutrition education Formal process to instruct or train a patient/client in a skill or to impart knowledge to help patients/clients voluntarily manage or modify food choices and eating behavior to maintain or improve health. Outcome: Completed/Met Date Met:  07/22/14 Nutrition Education Note Was consulted for nutrition education, per md patient needs renal ed specificly.   RD consulted for Renal Education. Provided "Food Pyramid for Healthy Eating with Kidney Disease" to patient.   Explained why diet restrictions are needed and provided lists of foods to limit/avoid that are high in potassium, sodium, and phosphorus. Provided specific recommendations on safer alternatives of these foods. For example, instead of sweet potatoes/cooked spinach substitute  collard greens, Instead of Pepsi/Coke drink sprite, instead of lima beans do green beans and finally talked about reducing high sodium foods such as lunch meats and sticking to fresh foods.   Because patient is CKD 3 and not ESRD, told to try to limit K, Po, Na foods instead of avoid. Teach back method used.  Expect fair  compliance.  Body mass index is 22.89 kg/(m^2). Pt meets criteria for normal weight based on current BMI.  Current diet order is heart healthy, patient is consuming approximately 100% of meals at this time. Labs and medications reviewed. No further nutrition interventions warranted at this time.  If additional nutrition issues arise, please re-consult RD.  Burtis Junes RD, LDN Nutrition Pager: [phone] 07/22/2014 1:00 PM

## 2014-07-23 DIAGNOSIS — R0600 Dyspnea, unspecified: Secondary | ICD-10-CM

## 2014-07-23 LAB — COMPREHENSIVE METABOLIC PANEL
ALBUMIN: 2.5 g/dL — AB (ref 3.5–5.2)
ALK PHOS: 53 U/L (ref 39–117)
ALT: 25 U/L (ref 0–35)
AST: 65 U/L — ABNORMAL HIGH (ref 0–37)
Anion gap: 3 — ABNORMAL LOW (ref 5–15)
BILIRUBIN TOTAL: 0.3 mg/dL (ref 0.3–1.2)
BUN: 33 mg/dL — ABNORMAL HIGH (ref 6–23)
CO2: 31 mmol/L (ref 19–32)
Calcium: 8.3 mg/dL — ABNORMAL LOW (ref 8.4–10.5)
Chloride: 104 mmol/L (ref 96–112)
Creatinine, Ser: 2.1 mg/dL — ABNORMAL HIGH (ref 0.50–1.10)
GFR calc Af Amer: 24 mL/min — ABNORMAL LOW (ref >90)
GFR calc non Af Amer: 21 mL/min — ABNORMAL LOW (ref >90)
Glucose, Bld: 140 mg/dL — ABNORMAL HIGH (ref 70–99)
Potassium: 3.4 mmol/L — ABNORMAL LOW (ref 3.5–5.1)
Sodium: 138 mmol/L (ref 135–145)
Total Protein: 5.6 g/dL — ABNORMAL LOW (ref 6.0–8.3)

## 2014-07-23 LAB — URINE CULTURE

## 2014-07-23 MED ORDER — PREDNISONE 20 MG PO TABS: 40.0000 mg | ORAL_TABLET | Freq: Every day | ORAL | Status: AC

## 2014-07-23 MED ORDER — BUDESONIDE-FORMOTEROL FUMARATE 160-4.5 MCG/ACT IN AERO: 2.0000 | INHALATION_SPRAY | Freq: Two times a day (BID) | RESPIRATORY_TRACT | Status: AC

## 2014-07-23 MED ORDER — ISOSORBIDE MONONITRATE ER 30 MG PO TB24: 30.0000 mg | ORAL_TABLET | Freq: Every day | ORAL | Status: DC

## 2014-07-23 MED ORDER — LISINOPRIL 10 MG PO TABS: 10.0000 mg | ORAL_TABLET | Freq: Every day | ORAL | Status: DC

## 2014-07-23 MED ORDER — CIPROFLOXACIN HCL 500 MG PO TABS: 500.0000 mg | ORAL_TABLET | Freq: Every day | ORAL | Status: AC

## 2014-07-23 MED ORDER — CARVEDILOL 25 MG PO TABS: 25.0000 mg | ORAL_TABLET | Freq: Two times a day (BID) | ORAL | Status: DC

## 2014-07-23 MED ORDER — FUROSEMIDE 40 MG PO TABS: 40.0000 mg | ORAL_TABLET | Freq: Every day | ORAL | Status: DC

## 2014-07-23 MED ORDER — ALLOPURINOL 100 MG PO TABS: 100.0000 mg | ORAL_TABLET | Freq: Every day | ORAL | Status: AC

## 2014-07-23 NOTE — Progress Notes (Signed)
Pt d/c'd to home with son. D/c Rx and instructions given to and d/w son.

## 2014-07-23 NOTE — Discharge Summary (Addendum)
Physician Discharge Summary  Milanni Ayub MRN: [id number] DOB/AGE: March 26, 1931 81 y.o.  PCP: No primary care provider on file.   Admit date: 2014/08/09 Discharge date: 07/23/2014  Discharge Diagnoses:     Active Problems:   Cellulitis  ICD Acute gout flare Hypertension   Follow-up recommendations PCP to follow-up on the results of the 2-D echo Follow-up CBC , magnesium, and CMP in 1 week Patient eventually would need referral to establish with cardiology outpatient     Medication List    STOP taking these medications        lisinopril-hydrochlorothiazide 20-12.5 MG per tablet  Commonly known as:  PRINZIDE,ZESTORETIC      TAKE these medications        allopurinol 100 MG tablet  Commonly known as:  ZYLOPRIM  Take 1 tablet (100 mg total) by mouth daily.     aspirin EC 81 MG tablet  Take 81 mg by mouth daily.     budesonide-formoterol 160-4.5 MCG/ACT inhaler  Commonly known as:  SYMBICORT  Inhale 2 puffs into the lungs 2 (two) times daily.     carvedilol 25 MG tablet  Commonly known as:  COREG  Take 1 tablet (25 mg total) by mouth 2 (two) times daily with a meal.     ciprofloxacin 500 MG tablet  Commonly known as:  CIPRO  Take 1 tablet (500 mg total) by mouth daily with breakfast.     furosemide 40 MG tablet  Commonly known as:  LASIX  Take 1 tablet (40 mg total) by mouth daily.     isosorbide mononitrate 30 MG 24 hr tablet  Commonly known as:  IMDUR  Take 1 tablet (30 mg total) by mouth daily.     lisinopril 10 MG tablet  Commonly known as:  ZESTRIL  Take 1 tablet (10 mg total) by mouth daily.     predniSONE 20 MG tablet  Commonly known as:  DELTASONE  Take 2 tablets (40 mg total) by mouth daily with breakfast.        Discharge Condition: *Stable Disposition: Home health PT   Consults: None    Significant Diagnostic Studies: Dg Knee 1-2 Views Left  08/09/14   CLINICAL DATA:  Nontraumatic left knee pain for 2 weeks  EXAM: LEFT KNEE - 1-2  VIEW  COMPARISON:  None.  FINDINGS: There is no evidence of fracture, dislocation, or joint effusion. There is no evidence of arthropathy or other focal bone abnormality. Soft tissues are unremarkable.  IMPRESSION: Negative.   Electronically Signed   By: Andreas Newport M.D.   On: 08-09-14 23:05   Dg Hand Complete Left  2014/08/09   CLINICAL DATA:  Nontraumatic left hand pain for 2 weeks  EXAM: LEFT HAND - COMPLETE 3+ VIEW  COMPARISON:  None.  FINDINGS: There is no evidence of fracture or dislocation. There is no focal bone abnormality. Moderate arthritic changes are present at the carpometacarpal articulations. There is a dorsal carpal boss. Soft tissues are unremarkable.  IMPRESSION: Moderate arthritic changes. Negative for fracture or other acute abnormality.   Electronically Signed   By: Andreas Newport M.D.   On: 08/09/2014 23:17    2-D echo Pending   Microbiology: Recent Results (from the past 240 hour(s))  Urine culture     Status: None   Collection Time: 08/09/14  3:59 PM  Result Value Ref Range Status   Specimen Description URINE, CLEAN CATCH  Final   Special Requests NONE  Final   Colony Count  Final    20,OOO COLONIES/ML Performed at [facility]   Final    Multiple bacterial morphotypes present, none predominant. Suggest appropriate recollection if clinically indicated. Performed at [facility]    Report Status 07/23/2014 FINAL  Final  Culture, blood (routine x 2)     Status: None (Preliminary result)   Collection Time: 07/21/14  5:38 PM  Result Value Ref Range Status   Specimen Description BLOOD ARM RIGHT  Final   Special Requests BOTTLES DRAWN AEROBIC AND ANAEROBIC 4CC  Final   Culture   Final           BLOOD CULTURE RECEIVED NO GROWTH TO DATE CULTURE WILL BE HELD FOR 5 DAYS BEFORE ISSUING A FINAL NEGATIVE REPORT Performed at [facility]    Report Status PENDING  Incomplete  Culture, blood (routine x 2)     Status: None  (Preliminary result)   Collection Time: 07/21/14  8:32 PM  Result Value Ref Range Status   Specimen Description BLOOD LEFT HAND  Final   Special Requests BOTTLES DRAWN AEROBIC ONLY [place]  Final   Culture   Final           BLOOD CULTURE RECEIVED NO GROWTH TO DATE CULTURE WILL BE HELD FOR 5 DAYS BEFORE ISSUING A FINAL NEGATIVE REPORT Performed at [facility]    Report Status PENDING  Incomplete     Labs: Results for orders placed or performed during the hospital encounter of 07/21/14 (from the past 48 hour(s))  Basic metabolic panel     Status: Abnormal   Collection Time: 07/21/14  2:17 PM  Result Value Ref Range   Sodium 136 135 - 145 mmol/L   Potassium 3.0 (L) 3.5 - 5.1 mmol/L   Chloride 100 96 - 112 mmol/L   CO2 28 19 - 32 mmol/L   Glucose, Bld 107 (H) 70 - 99 mg/dL   BUN 18 6 - 23 mg/dL   Creatinine, Ser 1.78 (H) 0.50 - 1.10 mg/dL   Calcium 8.9 8.4 - 10.5 mg/dL   GFR calc non Af Amer 26 (L) >90 mL/min   GFR calc Af Amer 30 (L) >90 mL/min    Comment: (NOTE) The eGFR has been calculated using the CKD EPI equation. This calculation has not been validated in all clinical situations. eGFR's persistently <90 mL/min signify possible Chronic Kidney Disease.    Anion gap 8 5 - 15  CBC with Differential     Status: Abnormal   Collection Time: 07/21/14  2:17 PM  Result Value Ref Range   WBC 5.6 4.0 - 10.5 K/uL   RBC 4.41 3.87 - 5.11 MIL/uL   Hemoglobin 10.5 (L) 12.0 - 15.0 g/dL   HCT 32.6 (L) 36.0 - 46.0 %   MCV 73.9 (L) 78.0 - 100.0 fL   MCH 23.8 (L) 26.0 - 34.0 pg   MCHC 32.2 30.0 - 36.0 g/dL   RDW 15.4 11.5 - 15.5 %   Platelets 260 150 - 400 K/uL   Neutrophils Relative % 56 43 - 77 %   Neutro Abs 3.1 1.7 - 7.7 K/uL   Lymphocytes Relative 29 12 - 46 %   Lymphs Abs 1.7 0.7 - 4.0 K/uL   Monocytes Relative 14 (H) 3 - 12 %   Monocytes Absolute 0.8 0.1 - 1.0 K/uL   Eosinophils Relative 1 0 - 5 %   Eosinophils Absolute 0.1 0.0 - 0.7 K/uL   Basophils Relative 0 0 -  1 %    Basophils Absolute 0.0 0.0 - 0.1 K/uL  Brain natriuretic peptide     Status: Abnormal   Collection Time: 07/21/14  2:17 PM  Result Value Ref Range   B Natriuretic Peptide 402.1 (H) 0.0 - 100.0 pg/mL  Urinalysis, Routine w reflex microscopic     Status: Abnormal   Collection Time: 07/21/14  3:59 PM  Result Value Ref Range   Color, Urine STRAW (A) YELLOW   APPearance CLEAR CLEAR   Specific Gravity, Urine 1.006 1.005 - 1.030   pH 6.5 5.0 - 8.0   Glucose, UA NEGATIVE NEGATIVE mg/dL   Hgb urine dipstick TRACE (A) NEGATIVE   Bilirubin Urine NEGATIVE NEGATIVE   Ketones, ur NEGATIVE NEGATIVE mg/dL   Protein, ur NEGATIVE NEGATIVE mg/dL   Urobilinogen, UA 0.2 0.0 - 1.0 mg/dL   Nitrite NEGATIVE NEGATIVE   Leukocytes, UA NEGATIVE NEGATIVE  Urine culture     Status: None   Collection Time: 07/21/14  3:59 PM  Result Value Ref Range   Specimen Description URINE, CLEAN CATCH    Special Requests NONE    Colony Count      20,OOO COLONIES/ML Performed at [facility]    Culture      Multiple bacterial morphotypes present, none predominant. Suggest appropriate recollection if clinically indicated. Performed at [facility]    Report Status 07/23/2014 FINAL   Urine microscopic-add on     Status: None   Collection Time: 07/21/14  3:59 PM  Result Value Ref Range   Squamous Epithelial / LPF RARE RARE   RBC / HPF 0-2 <3 RBC/hpf   Bacteria, UA RARE RARE  I-stat troponin, ED     Status: Abnormal   Collection Time: 07/21/14  4:45 PM  Result Value Ref Range   Troponin i, poc 0.21 (HH) 0.00 - 0.08 ng/mL   Comment NOTIFIED PHYSICIAN    Comment 3            Comment: Due to the release kinetics of cTnI, a negative result within the first hours of the onset of symptoms does not rule out myocardial infarction with certainty. If myocardial infarction is still suspected, repeat the test at appropriate intervals.   Troponin I     Status: Abnormal   Collection Time: 07/21/14  5:03  PM  Result Value Ref Range   Troponin I 0.42 (H) <0.031 ng/mL    Comment:        PERSISTENTLY INCREASED TROPONIN VALUES IN THE RANGE OF 0.04-0.49 ng/mL CAN BE SEEN IN:       -UNSTABLE ANGINA       -CONGESTIVE HEART FAILURE       -MYOCARDITIS       -CHEST TRAUMA       -ARRYHTHMIAS       -LATE PRESENTING MYOCARDIAL INFARCTION       -COPD   CLINICAL FOLLOW-UP RECOMMENDED.   Creatinine, serum     Status: Abnormal   Collection Time: 07/21/14  5:03 PM  Result Value Ref Range   Creatinine, Ser 1.79 (H) 0.50 - 1.10 mg/dL   GFR calc non Af Amer 25 (L) >90 mL/min   GFR calc Af Amer 29 (L) >90 mL/min    Comment: (NOTE) The eGFR has been calculated using the CKD EPI equation. This calculation has not been validated in all clinical situations. eGFR's persistently <90 mL/min signify possible Chronic Kidney Disease.   Hepatic function panel     Status: Abnormal   Collection  Time: 07/21/14  5:03 PM  Result Value Ref Range   Total Protein 6.3 6.0 - 8.3 g/dL   Albumin 2.7 (L) 3.5 - 5.2 g/dL   AST 129 (H) 0 - 37 U/L   ALT 36 (H) 0 - 35 U/L   Alkaline Phosphatase 53 39 - 117 U/L   Total Bilirubin 1.2 0.3 - 1.2 mg/dL   Bilirubin, Direct 0.4 0.0 - 0.5 mg/dL   Indirect Bilirubin 0.8 0.3 - 0.9 mg/dL  Magnesium     Status: None   Collection Time: 07/21/14  5:03 PM  Result Value Ref Range   Magnesium 1.5 1.5 - 2.5 mg/dL  CBC     Status: Abnormal   Collection Time: 07/21/14  5:27 PM  Result Value Ref Range   WBC 5.4 4.0 - 10.5 K/uL   RBC 4.24 3.87 - 5.11 MIL/uL   Hemoglobin 9.9 (L) 12.0 - 15.0 g/dL   HCT 31.4 (L) 36.0 - 46.0 %   MCV 74.1 (L) 78.0 - 100.0 fL   MCH 23.3 (L) 26.0 - 34.0 pg   MCHC 31.5 30.0 - 36.0 g/dL   RDW 15.5 11.5 - 15.5 %   Platelets 239 150 - 400 K/uL  TSH     Status: None   Collection Time: 07/21/14  5:27 PM  Result Value Ref Range   TSH 2.253 0.350 - 4.500 uIU/mL  Hemoglobin A1c     Status: Abnormal   Collection Time: 07/21/14  5:27 PM  Result Value Ref Range    Hgb A1c MFr Bld 6.0 (H) 4.8 - 5.6 %    Comment: (NOTE)         Pre-diabetes: 5.7 - 6.4         Diabetes: >6.4         Glycemic control for adults with diabetes: <7.0    Mean Plasma Glucose 126 mg/dL    Comment: (NOTE) Performed At: [facility] [place], [place] [id number] Lindon Romp MD JO:[id number]   Brain natriuretic peptide     Status: Abnormal   Collection Time: 07/21/14  5:27 PM  Result Value Ref Range   B Natriuretic Peptide 355.3 (H) 0.0 - 100.0 pg/mL  Culture, blood (routine x 2)     Status: None (Preliminary result)   Collection Time: 07/21/14  5:38 PM  Result Value Ref Range   Specimen Description BLOOD ARM RIGHT    Special Requests BOTTLES DRAWN AEROBIC AND ANAEROBIC 4CC    Culture             BLOOD CULTURE RECEIVED NO GROWTH TO DATE CULTURE WILL BE HELD FOR 5 DAYS BEFORE ISSUING A FINAL NEGATIVE REPORT Performed at [facility]    Report Status PENDING   Culture, blood (routine x 2)     Status: None (Preliminary result)   Collection Time: 07/21/14  8:32 PM  Result Value Ref Range   Specimen Description BLOOD LEFT HAND    Special Requests BOTTLES DRAWN AEROBIC ONLY [place]    Culture             BLOOD CULTURE RECEIVED NO GROWTH TO DATE CULTURE WILL BE HELD FOR 5 DAYS BEFORE ISSUING A FINAL NEGATIVE REPORT Performed at [facility]    Report Status PENDING   Troponin I     Status: Abnormal   Collection Time: 07/21/14 11:22 PM  Result Value Ref Range   Troponin I 0.33 (H) <0.031 ng/mL    Comment:  PERSISTENTLY INCREASED TROPONIN VALUES IN THE RANGE OF 0.04-0.49 ng/mL CAN BE SEEN IN:       -UNSTABLE ANGINA       -CONGESTIVE HEART FAILURE       -MYOCARDITIS       -CHEST TRAUMA       -ARRYHTHMIAS       -LATE PRESENTING MYOCARDIAL INFARCTION       -COPD   CLINICAL FOLLOW-UP RECOMMENDED.   Troponin I     Status: Abnormal   Collection Time: 07/22/14  5:40 AM  Result Value Ref Range   Troponin I 0.27 (H) <0.031  ng/mL    Comment:        PERSISTENTLY INCREASED TROPONIN VALUES IN THE RANGE OF 0.04-0.49 ng/mL CAN BE SEEN IN:       -UNSTABLE ANGINA       -CONGESTIVE HEART FAILURE       -MYOCARDITIS       -CHEST TRAUMA       -ARRYHTHMIAS       -LATE PRESENTING MYOCARDIAL INFARCTION       -COPD   CLINICAL FOLLOW-UP RECOMMENDED.   Comprehensive metabolic panel     Status: Abnormal   Collection Time: 07/22/14  5:40 AM  Result Value Ref Range   Sodium 138 135 - 145 mmol/L   Potassium 3.5 3.5 - 5.1 mmol/L   Chloride 103 96 - 112 mmol/L   CO2 26 19 - 32 mmol/L   Glucose, Bld 140 (H) 70 - 99 mg/dL   BUN 20 6 - 23 mg/dL   Creatinine, Ser 1.85 (H) 0.50 - 1.10 mg/dL   Calcium 9.2 8.4 - 10.5 mg/dL   Total Protein 7.0 6.0 - 8.3 g/dL   Albumin 2.8 (L) 3.5 - 5.2 g/dL   AST 102 (H) 0 - 37 U/L   ALT 32 0 - 35 U/L   Alkaline Phosphatase 59 39 - 117 U/L   Total Bilirubin 0.4 0.3 - 1.2 mg/dL   GFR calc non Af Amer 24 (L) >90 mL/min   GFR calc Af Amer 28 (L) >90 mL/min    Comment: (NOTE) The eGFR has been calculated using the CKD EPI equation. This calculation has not been validated in all clinical situations. eGFR's persistently <90 mL/min signify possible Chronic Kidney Disease.    Anion gap 9 5 - 15  CBC     Status: Abnormal   Collection Time: 07/22/14  5:40 AM  Result Value Ref Range   WBC 4.0 4.0 - 10.5 K/uL   RBC 4.46 3.87 - 5.11 MIL/uL   Hemoglobin 10.2 (L) 12.0 - 15.0 g/dL   HCT 33.0 (L) 36.0 - 46.0 %   MCV 74.0 (L) 78.0 - 100.0 fL   MCH 22.9 (L) 26.0 - 34.0 pg   MCHC 30.9 30.0 - 36.0 g/dL   RDW 15.5 11.5 - 15.5 %   Platelets 263 150 - 400 K/uL  Uric acid     Status: None   Collection Time: 07/22/14  5:40 AM  Result Value Ref Range   Uric Acid, Serum 6.7 2.4 - 7.0 mg/dL     HPI :Brief narrative: 81 year old female who apparently has an ICD, history of CHF, CVA, hypertension, COPD, active smoker, recently moved to [place] from [place], followed by a nephrologist and her PCP,  presents to the ED with generalized swelling, mainly in her left upper extremity and left lower extremity. Patient apparently ran out of 4 different medications but has been taking her Lasix. Despite taking  the Lasix the patient has had worsening swelling and increasing pain in bilateral lower extremities. The left lower extremities warm to touch. Patient has difficulty standing and weightbearing.No apparent fever at home. She denies falling, denies any trauma. The patient also has had swelling of the left upper extremity appears to have some residual weakness from her previous stroke on the left side. Patient is normally able to ambulate with a walker or a cane. She denies any chest pain any shortness of breath. Patient has a history of gout but ran out of her allopurinol. She has a history of hypertension and ran out of her Coreg,imdur, zestoreic . Workup in the ED shows that she is afebrile hemodynamically stable , troponin 0.4, proBNP 355, creatinine 1.79, GFR 25, AST 129, ALT 36, WBC count 5.4  HOSPITAL COURSE:  Generalized swelling, multifactorial secondary to acute gout exacerbation prior history of CVA/mild cellulitis of the left lower extremity Worsening edema and warmth of the left upper left lower extremity could be secondary to gout It also appears that the patient has some dependent edema because of left upper extremity weakness vs hypoalbuminemia Nutrition consult for moderate protein calorie malnutrition  Abnormal troponin, likely demand ischemia 2-D echo pending to rule out wall motion abnormalities Continue Coreg, Isosorbide, lisinopril and aspirin ICD in place most likely secondary to cardiomyopathy and CK D   Cellulitis vs acute gout exacerbation of the left lower extremity/swelling of left upper extremity Uric acid 6.7 rx with Solu-Medrol , now continue prednisone for 5 more days Given chronic kidney disease would not use colchicine Initially started on vancomycin now switched  to Cipro for another 7 days Reviewed plain films of the left upper/ left lower extremity and they are negative I do not suspect septic arthritis Venous Doppler of the lower extremities negative Negative venous Doppler of bilateral upper extremities  CKD, stage III Baseline creatinine unknown Continue Lasix as the patient appears euvolemic Creatinine 1.78> 1.85  Hypokalemia Repleted  Hypomagnesemia Replete   Moderate protein calorie malnutrition Nutrition consult     Discharge Exam:  Blood pressure 159/77, pulse 79, temperature 97.6 F (36.4 C), temperature source Oral, resp. rate 16, height 5' 7"  (1.702 m), weight 67.2 kg (148 lb 2.4 oz), SpO2 97 %. Constitutional: Vital signs reviewed. Patient is a well-developed and well-nourished in no acute distress and cooperative with exam. Alert and oriented x3.  Head: Normocephalic and atraumatic  Ear: TM normal bilaterally  Mouth: no erythema or exudates, MMM Large well-healed scar on the right cheek  Eyes: PERRL, EOMI, conjunctivae normal, No scleral icterus.  Neck: Supple, Trachea midline normal ROM, No JVD, mass, thyromegaly, or carotid bruit present.  Cardiovascular: RRR, S1 normal, S2 normal, no MRG, pulses symmetric and intact bilaterally  Pulmonary/Chest: CTAB, no wheezes, rales, or rhonchi  Abdominal: Soft. Non-tender, non-distended, bowel sounds are normal, no masses, organomegaly, or guarding present.  GU: no CVA tenderness Musculoskeletal: No joint deformities, erythema, or stiffness, ROM full and no nontender BL LOWER extremity edema. Left greater than R.. Warm, red, ttp. Pulses present on doppler. Hematology: no cervical, inginal, or axillary adenopathy.         Discharge Instructions    Diet - low sodium heart healthy    Complete by:  As directed      Increase activity slowly    Complete by:  As directed              Signed: Kealani Leckey 07/23/2014, 11:24 AM

## 2014-07-23 NOTE — Progress Notes (Signed)
Utilization review completed.  

## 2014-07-23 NOTE — Progress Notes (Signed)
  Echocardiogram 2D Echocardiogram has been performed.  Katherine Chapman Katherine Chapman 07/23/2014, 11:54 AM

## 2014-07-23 NOTE — Care Management Note (Signed)
    Page 1 of 1   07/23/2014     4:50:22 PM CARE MANAGEMENT NOTE 07/23/2014  Patient:  Katherine Chapman, Katherine Chapman   Account Number:  [id number]  Date Initiated:  07/23/2014  Documentation initiated by:  [facility]  Subjective/Objective Assessment:   adm: Generalized swelling     Action/Plan:   discharge planning   Anticipated DC Date:  07/23/2014   Anticipated DC Plan:  [place]  CM consult  PCP issues      [facility] Choice  HOME HEALTH   Choice offered to / List presented to:  C-1 Patient        [place] arranged  [place].   Status of service:  Completed, signed off Medicare Important Message given?   (If response is "NO", the following Medicare IM given date fields will be blank) Date Medicare IM given:   Medicare IM given by:   Date Additional Medicare IM given:   Additional Medicare IM given by:    Discharge Disposition:  [place]  Per UR Regulation:    If discussed at Long Length of Stay Meetings, dates discussed:    Comments:  07/23/14 CM met with pt briefly to discuss PCP and gave pt [facility] Resource to establish a PCP; however, pt to have [place] and continued later.  CM met again with pt who states [facility] will be fine to render HHPT/OT/aide.   address and contact information verified by pt.  referral called to stephanie of [place].  No other CM needs were communicated. Jarrett Ables, BSN, [place].

## 2014-07-24 LAB — HIV ANTIBODY (ROUTINE TESTING W REFLEX): HIV SCREEN 4TH GENERATION: NONREACTIVE

## 2014-07-28 LAB — CULTURE, BLOOD (ROUTINE X 2)
CULTURE: NO GROWTH
CULTURE: NO GROWTH

## 2014-08-11 ENCOUNTER — Encounter (HOSPITAL_COMMUNITY): Payer: Self-pay | Admitting: *Deleted

## 2014-08-11 ENCOUNTER — Observation Stay (HOSPITAL_COMMUNITY)
Admission: EM | Admit: 2014-08-11 | Discharge: 2014-08-15 | Disposition: A | Discharge status: Home / Self Care | Payer: Medicare Other | Attending: Internal Medicine | Admitting: Internal Medicine

## 2014-08-11 ENCOUNTER — Observation Stay (HOSPITAL_COMMUNITY): Payer: Medicare Other

## 2014-08-11 ENCOUNTER — Emergency Department (HOSPITAL_COMMUNITY): Payer: Medicare Other

## 2014-08-11 DIAGNOSIS — Z9889 Other specified postprocedural states: Secondary | ICD-10-CM | POA: Diagnosis not present

## 2014-08-11 DIAGNOSIS — J449 Chronic obstructive pulmonary disease, unspecified: Secondary | ICD-10-CM | POA: Diagnosis not present

## 2014-08-11 DIAGNOSIS — R55 Syncope and collapse: Principal | ICD-10-CM | POA: Insufficient documentation

## 2014-08-11 DIAGNOSIS — F17210 Nicotine dependence, cigarettes, uncomplicated: Secondary | ICD-10-CM | POA: Diagnosis not present

## 2014-08-11 DIAGNOSIS — Z4502 Encounter for adjustment and management of automatic implantable cardiac defibrillator: Secondary | ICD-10-CM

## 2014-08-11 DIAGNOSIS — Z8673 Personal history of transient ischemic attack (TIA), and cerebral infarction without residual deficits: Secondary | ICD-10-CM | POA: Diagnosis not present

## 2014-08-11 DIAGNOSIS — R627 Adult failure to thrive: Secondary | ICD-10-CM

## 2014-08-11 DIAGNOSIS — E860 Dehydration: Secondary | ICD-10-CM

## 2014-08-11 DIAGNOSIS — N179 Acute kidney failure, unspecified: Secondary | ICD-10-CM | POA: Diagnosis not present

## 2014-08-11 DIAGNOSIS — I129 Hypertensive chronic kidney disease with stage 1 through stage 4 chronic kidney disease, or unspecified chronic kidney disease: Secondary | ICD-10-CM | POA: Diagnosis not present

## 2014-08-11 DIAGNOSIS — Z95810 Presence of automatic (implantable) cardiac defibrillator: Secondary | ICD-10-CM | POA: Insufficient documentation

## 2014-08-11 DIAGNOSIS — Z79899 Other long term (current) drug therapy: Secondary | ICD-10-CM | POA: Insufficient documentation

## 2014-08-11 DIAGNOSIS — E440 Moderate protein-calorie malnutrition: Secondary | ICD-10-CM | POA: Insufficient documentation

## 2014-08-11 DIAGNOSIS — I509 Heart failure, unspecified: Secondary | ICD-10-CM | POA: Insufficient documentation

## 2014-08-11 DIAGNOSIS — N183 Chronic kidney disease, stage 3 unspecified: Secondary | ICD-10-CM | POA: Diagnosis present

## 2014-08-11 DIAGNOSIS — Z7982 Long term (current) use of aspirin: Secondary | ICD-10-CM | POA: Diagnosis not present

## 2014-08-11 DIAGNOSIS — F0390 Unspecified dementia without behavioral disturbance: Secondary | ICD-10-CM | POA: Diagnosis not present

## 2014-08-11 DIAGNOSIS — E876 Hypokalemia: Secondary | ICD-10-CM | POA: Diagnosis not present

## 2014-08-11 DIAGNOSIS — I10 Essential (primary) hypertension: Secondary | ICD-10-CM

## 2014-08-11 HISTORY — DX: Other specified postprocedural states: Z98.890

## 2014-08-11 LAB — URINALYSIS, ROUTINE W REFLEX MICROSCOPIC
Bilirubin Urine: NEGATIVE
GLUCOSE, UA: NEGATIVE mg/dL
Hgb urine dipstick: NEGATIVE
KETONES UR: NEGATIVE mg/dL
Nitrite: NEGATIVE
PROTEIN: NEGATIVE mg/dL
Specific Gravity, Urine: 1.007 (ref 1.005–1.030)
UROBILINOGEN UA: 0.2 mg/dL (ref 0.0–1.0)
pH: 7 (ref 5.0–8.0)

## 2014-08-11 LAB — PHOSPHORUS: Phosphorus: 3.3 mg/dL (ref 2.3–4.6)

## 2014-08-11 LAB — CBC
HCT: 28.3 % — ABNORMAL LOW (ref 36.0–46.0)
Hemoglobin: 9 g/dL — ABNORMAL LOW (ref 12.0–15.0)
MCH: 23.3 pg — AB (ref 26.0–34.0)
MCHC: 31.8 g/dL (ref 30.0–36.0)
MCV: 73.1 fL — ABNORMAL LOW (ref 78.0–100.0)
Platelets: 193 10*3/uL (ref 150–400)
RBC: 3.87 MIL/uL (ref 3.87–5.11)
RDW: 15.6 % — ABNORMAL HIGH (ref 11.5–15.5)
WBC: 6 10*3/uL (ref 4.0–10.5)

## 2014-08-11 LAB — MAGNESIUM: MAGNESIUM: 1.7 mg/dL (ref 1.5–2.5)

## 2014-08-11 LAB — COMPREHENSIVE METABOLIC PANEL
ALK PHOS: 52 U/L (ref 39–117)
ALT: 12 U/L (ref 0–35)
AST: 35 U/L (ref 0–37)
Albumin: 2.8 g/dL — ABNORMAL LOW (ref 3.5–5.2)
Anion gap: 6 (ref 5–15)
BILIRUBIN TOTAL: 0.5 mg/dL (ref 0.3–1.2)
BUN: 33 mg/dL — ABNORMAL HIGH (ref 6–23)
CHLORIDE: 104 mmol/L (ref 96–112)
CO2: 30 mmol/L (ref 19–32)
Calcium: 8.6 mg/dL (ref 8.4–10.5)
Creatinine, Ser: 2.02 mg/dL — ABNORMAL HIGH (ref 0.50–1.10)
GFR calc Af Amer: 25 mL/min — ABNORMAL LOW (ref >90)
GFR, EST NON AFRICAN AMERICAN: 22 mL/min — AB (ref >90)
GLUCOSE: 111 mg/dL — AB (ref 70–99)
POTASSIUM: 2.6 mmol/L — AB (ref 3.5–5.1)
SODIUM: 140 mmol/L (ref 135–145)
Total Protein: 6.1 g/dL (ref 6.0–8.3)

## 2014-08-11 LAB — I-STAT TROPONIN, ED: TROPONIN I, POC: 0.02 ng/mL (ref 0.00–0.08)

## 2014-08-11 LAB — URINE MICROSCOPIC-ADD ON

## 2014-08-11 MED ORDER — ISOSORBIDE MONONITRATE ER 30 MG PO TB24
30.0000 mg | ORAL_TABLET | Freq: Every day | ORAL | Status: DC
  Administered 2014-08-12: 30 mg via ORAL
  Filled 2014-08-11 (×2): qty 1

## 2014-08-11 MED ORDER — CARVEDILOL 25 MG PO TABS
25.0000 mg | ORAL_TABLET | Freq: Two times a day (BID) | ORAL | Status: DC
  Administered 2014-08-12 – 2014-08-15 (×7): 25 mg via ORAL
  Filled 2014-08-11 (×9): qty 1

## 2014-08-11 MED ORDER — ASPIRIN EC 81 MG PO TBEC
81.0000 mg | DELAYED_RELEASE_TABLET | Freq: Every day | ORAL | Status: DC
Start: 2014-08-12 — End: 2014-08-15
  Administered 2014-08-12 – 2014-08-15 (×4): 81 mg via ORAL
  Filled 2014-08-11 (×4): qty 1

## 2014-08-11 MED ORDER — HYDRALAZINE HCL 20 MG/ML IJ SOLN
10.0000 mg | Freq: Four times a day (QID) | INTRAMUSCULAR | Status: DC | PRN
  Administered 2014-08-11 – 2014-08-12 (×2): 10 mg via INTRAVENOUS
  Filled 2014-08-11 (×2): qty 1

## 2014-08-11 MED ORDER — BUDESONIDE-FORMOTEROL FUMARATE 160-4.5 MCG/ACT IN AERO
2.0000 | INHALATION_SPRAY | Freq: Two times a day (BID) | RESPIRATORY_TRACT | Status: DC
Start: 2014-08-11 — End: 2014-08-15
  Administered 2014-08-11 – 2014-08-15 (×7): 2 via RESPIRATORY_TRACT
  Filled 2014-08-11: qty 6

## 2014-08-11 MED ORDER — POTASSIUM CHLORIDE 10 MEQ/100ML IV SOLN
10.0000 meq | INTRAVENOUS | Status: AC
  Administered 2014-08-11 (×2): 10 meq via INTRAVENOUS
  Filled 2014-08-11 (×2): qty 100

## 2014-08-11 MED ORDER — SODIUM CHLORIDE 0.9 % IV BOLUS (SEPSIS)
500.0000 mL | Freq: Once | INTRAVENOUS | Status: AC
  Administered 2014-08-11: 500 mL via INTRAVENOUS

## 2014-08-11 MED ORDER — POTASSIUM CHLORIDE CRYS ER 20 MEQ PO TBCR
40.0000 meq | EXTENDED_RELEASE_TABLET | Freq: Once | ORAL | Status: AC
  Administered 2014-08-11: 40 meq via ORAL
  Filled 2014-08-11: qty 2

## 2014-08-11 MED ORDER — ALLOPURINOL 100 MG PO TABS
100.0000 mg | ORAL_TABLET | Freq: Every day | ORAL | Status: DC
  Administered 2014-08-12 – 2014-08-15 (×4): 100 mg via ORAL
  Filled 2014-08-11 (×4): qty 1

## 2014-08-11 MED ORDER — POTASSIUM CHLORIDE CRYS ER 20 MEQ PO TBCR
60.0000 meq | EXTENDED_RELEASE_TABLET | Freq: Once | ORAL | Status: AC
  Administered 2014-08-11: 60 meq via ORAL
  Filled 2014-08-11: qty 3

## 2014-08-11 MED ORDER — SODIUM CHLORIDE 0.9 % IJ SOLN
3.0000 mL | Freq: Two times a day (BID) | INTRAMUSCULAR | Status: DC
  Administered 2014-08-12 – 2014-08-15 (×6): 3 mL via INTRAVENOUS

## 2014-08-11 MED ORDER — ENOXAPARIN SODIUM 30 MG/0.3ML ~~LOC~~ SOLN
30.0000 mg | SUBCUTANEOUS | Status: DC
  Administered 2014-08-11 – 2014-08-14 (×4): 30 mg via SUBCUTANEOUS
  Filled 2014-08-11 (×5): qty 0.3

## 2014-08-11 NOTE — H&P (Signed)
History and Physical  Katherine Chapman ZOX:[id number] DOB: 11-Feb-1931 DOA: 08/11/2014  Referring physician: EDP PCP: No primary care provider on file.   Chief Complaint: syncope/hypokalemia/elevated cr  HPI: Katherine Chapman is a 81 y.o. female  Demented female not able to provide history, no family member at bedside. hpi obtained from chart review.  Per chart review patient has h/o loop recorder, CHF, CVA, hypertension, COPD, active smoker, recently moved to [place] from [place], followed by a nephrologist and her PCP presents today after 2-3 sec of syncope. She states she felt well til she had this spell. No dizziness or chest pain. No palpitations. Now no chest pain, no SOB. She has a loop recorder. Her son said she was standing and she starred and would have fallen but he caught her. She was "out of it" for 2-3 sec.  Currently patient does not seem in distress, denies chest pain, no dizziness, denies n/v/d, no fever. Reported she is from new [place], oriented to self, not to place or time.  Cardiology seen patient in the ED,  loop recorder interrogation did not review arrhythmia. cxr unremarkable. Lab does show hypokalemia/elevated cr, hospitalist called for admit.  Review of Systems:  Limited due to dementia, detail per HPI. Review of systems are otherwise negative  Past Medical History  Diagnosis Date  . CHF (congestive heart failure)   . Hypertension   . History of loop recorder    Past Surgical History  Procedure Laterality Date  . Ep implantable device      loop recorder   Social History:  reports that she has been smoking.  She does not have any smokeless tobacco history on file. She reports that she does not drink alcohol or use illicit drugs. Patient lives at home with family & is able to participate in activities of daily living with home health /Pt/walker  Allergies  Allergen Reactions  . Penicillins Hives    No family history on file.    Prior to Admission  medications   Medication Sig Start Date End Date Taking? Authorizing Provider  allopurinol (ZYLOPRIM) 100 MG tablet Take 1 tablet (100 mg total) by mouth daily. 07/23/14  Yes Richarda Overlie, MD  aspirin EC 81 MG tablet Take 81 mg by mouth daily.   Yes Historical Provider, MD  budesonide-formoterol (SYMBICORT) 160-4.5 MCG/ACT inhaler Inhale 2 puffs into the lungs 2 (two) times daily. Patient taking differently: Inhale 2 puffs into the lungs 2 (two) times daily as needed (shortness of breath).  07/23/14  Yes Richarda Overlie, MD  carvedilol (COREG) 25 MG tablet Take 1 tablet (25 mg total) by mouth 2 (two) times daily with a meal. 07/23/14  Yes Richarda Overlie, MD  furosemide (LASIX) 40 MG tablet Take 1 tablet (40 mg total) by mouth daily. 07/23/14  Yes Richarda Overlie, MD  isosorbide mononitrate (IMDUR) 30 MG 24 hr tablet Take 1 tablet (30 mg total) by mouth daily. 07/23/14  Yes Richarda Overlie, MD  lisinopril (ZESTRIL) 10 MG tablet Take 1 tablet (10 mg total) by mouth daily. 07/23/14  Yes Richarda Overlie, MD    Physical Exam: BP 170/80 mmHg  Pulse 55  Temp(Src) 97.7 F (36.5 C) (Oral)  Resp 12  Ht  (1.702 m)  Wt 67.132 kg (148 lb)  BMI 23.17 kg/m2  SpO2 100%  General:  Alert, following commend, frail, oriented to self only, NAD. Eyes: perrl ENT: chronic scar right lower face, no acute findings Neck: suppler, no JVD Cardiovascular: RRR Respiratory: CTABL Abdomen: soft/NT/ND,  positive bowel sounds Skin: no rash Musculoskeletal: pitting edema bilateral lower extremities,upper/ lower extremity strength 3-4/5, equal. Psychiatric: calm, poor memoary Neurologic: baseline dementia, oriented to self, no obvious focal deficit, though there is h/o CVA listed in chart.          Labs on Admission:  Basic Metabolic Panel:  Recent Labs Lab 08/11/14 1410  NA 140  K 2.6*  CL 104  CO2 30  GLUCOSE 111*  BUN 33*  CREATININE 2.02*  CALCIUM 8.6  MG 1.7  PHOS 3.3   Liver Function Tests:  Recent  Labs Lab 08/11/14 1410  AST 35  ALT 12  ALKPHOS 52  BILITOT 0.5  PROT 6.1  ALBUMIN 2.8*   No results for input(s): LIPASE, AMYLASE in the last 168 hours. No results for input(s): AMMONIA in the last 168 hours. CBC:  Recent Labs Lab 08/11/14 1410  WBC 6.0  HGB 9.0*  HCT 28.3*  MCV 73.1*  PLT 193   Cardiac Enzymes: No results for input(s): CKTOTAL, CKMB, CKMBINDEX, TROPONINI in the last 168 hours.  BNP (last 3 results)  Recent Labs  07/21/14 1417 07/21/14 1727  BNP 402.1* 355.3*    ProBNP (last 3 results) No results for input(s): PROBNP in the last 8760 hours.  CBG: No results for input(s): GLUCAP in the last 168 hours.  Radiological Exams on Admission: Dg Chest 2 View  08/11/2014   CLINICAL DATA:  Loss of consciousness  EXAM: CHEST  2 VIEW  COMPARISON:  None  FINDINGS: The heart size and mediastinal contours are within normal limits. Both lungs are clear. The visualized skeletal structures are unremarkable.  IMPRESSION: No active cardiopulmonary disease.   Electronically Signed   By: Signa Kellaylor  Stroud M.D.   On: 08/11/2014 12:58    EKG: Independently reviewed. NSR, with pvc's, no acute st/t changes  Assessment/Plan Present on Admission:  . Syncope, brief  Syncope: on arrhythmia per loop recorder interrogation per cardiology, per chart review recent echo cardiogram (07/2014) no wall motion abnormality/adequate LVEF. No focal deficit on exam. Orthostatic vital sign pending.  From dehydratiion? Patient does has lower extremity edema on exam, will admit to tele. Hold lasix. Will await orhtostatic vital sign to determine fluids.  Hypokalemia: replace k  Elevation of cr, check ua/renal us, hold lasix/lisinopril  ZOX:[id number]:continue coreg/imdur  FTT/dementia: PT/OT, SNF?  H/o copd per chart, stable, lung exam benign. Currently someday smoker. Continue symbicort.   Consultants: cardiology  Code Status: full, assumed, not family member at bedside to verify  Family  Communication: patient  Disposition Plan: admit to obs/tele  Time spent: 70mins  Briana Farner [facility] Pager [phone]

## 2014-08-11 NOTE — Consult Note (Addendum)
Katherine Chapman is an 81 y.o. female.    Primary Cardiologist:new No primary care provider on file.  Chief Complaint: ICD shock HPI: 81 year old female who has an ICD, history of CHF, CVA, hypertension, COPD, active smoker, recently moved to [place] from [place], followed by a nephrologist and her PCP presents today after 2-3 sec of syncope.  She states she felt well til she had this spell.  No dizziness or chest pain.  No palpitations.  Now no chest pain, no SOB.  She has a loop recorder.  Her son said she was standing and she starred and would have fallen but he caught her.  She was "out of it" for 2-3 sec.     Recently admitted for generalized swelling.  Her K+ today is 2.6.    Her Kdur ran out 2 days ago. EKG:  Sinus rhythm Multiple premature complexes, vent & supraven Abnormal R-wave progression, early transition Nonspecific T abnrm, anterolateral leads  Recent Echo 07/23/14 60-65% G1DD, PA pk pressure 40 mm Hg- moderate pul. HTN.     Interrogation with rates set high -placed for syncope but not interrogated since 2014.  She in Feb she had SVT at 160.  Today no arrhthymias though with high detectation rates it may not have been noted.      Past Medical History  Diagnosis Date  . CHF (congestive heart failure)   . Hypertension   . History of loop recorder     Past Surgical History  Procedure Laterality Date  . Ep implantable device      loop recorder    No family history on file. Social History:  reports that she has been smoking.  She does not have any smokeless tobacco history on file. She reports that she does not drink alcohol or use illicit drugs. Lives with her son.  Allergies:  Allergies  Allergen Reactions  . Penicillins Hives    OUtpt meds: No current facility-administered medications on file prior to encounter.   Current Outpatient Prescriptions on File Prior to Encounter  Medication Sig Dispense Refill  . allopurinol (ZYLOPRIM) 100 MG tablet  Take 1 tablet (100 mg total) by mouth daily. 60 tablet 0  . aspirin EC 81 MG tablet Take 81 mg by mouth daily.    . budesonide-formoterol (SYMBICORT) 160-4.5 MCG/ACT inhaler Inhale 2 puffs into the lungs 2 (two) times daily. (Patient taking differently: Inhale 2 puffs into the lungs 2 (two) times daily as needed (shortness of breath). ) 1 Inhaler 12  . carvedilol (COREG) 25 MG tablet Take 1 tablet (25 mg total) by mouth 2 (two) times daily with a meal. 60 tablet 3  . furosemide (LASIX) 40 MG tablet Take 1 tablet (40 mg total) by mouth daily. 30 tablet 2  . isosorbide mononitrate (IMDUR) 30 MG 24 hr tablet Take 1 tablet (30 mg total) by mouth daily. 30 tablet 2  . lisinopril (ZESTRIL) 10 MG tablet Take 1 tablet (10 mg total) by mouth daily. 30 tablet 0      Results for orders placed or performed during the hospital encounter of 08/11/14 (from the past 48 hour(s))  CBC     Status: Abnormal   Collection Time: 08/11/14  2:10 PM  Result Value Ref Range   WBC 6.0 4.0 - 10.5 K/uL   RBC 3.87 3.87 - 5.11 MIL/uL   Hemoglobin 9.0 (L) 12.0 - 15.0 g/dL   HCT 28.3 (L) 36.0 - 46.0 %   MCV  73.1 (L) 78.0 - 100.0 fL   MCH 23.3 (L) 26.0 - 34.0 pg   MCHC 31.8 30.0 - 36.0 g/dL   RDW 15.6 (H) 11.5 - 15.5 %   Platelets 193 150 - 400 K/uL  Comprehensive metabolic panel     Status: Abnormal   Collection Time: 08/11/14  2:10 PM  Result Value Ref Range   Sodium 140 135 - 145 mmol/L   Potassium 2.6 (LL) 3.5 - 5.1 mmol/L    Comment: REPEATED TO VERIFY CRITICAL RESULT CALLED TO, READ BACK BY AND VERIFIED WITH: K YURL,RN 03.11.16 1534 M SHIPMAN    Chloride 104 96 - 112 mmol/L   CO2 30 19 - 32 mmol/L   Glucose, Bld 111 (H) 70 - 99 mg/dL   BUN 33 (H) 6 - 23 mg/dL   Creatinine, Ser 2.02 (H) 0.50 - 1.10 mg/dL   Calcium 8.6 8.4 - 10.5 mg/dL   Total Protein 6.1 6.0 - 8.3 g/dL   Albumin 2.8 (L) 3.5 - 5.2 g/dL   AST 35 0 - 37 U/L   ALT 12 0 - 35 U/L   Alkaline Phosphatase 52 39 - 117 U/L   Total Bilirubin 0.5  0.3 - 1.2 mg/dL   GFR calc non Af Amer 22 (L) >90 mL/min   GFR calc Af Amer 25 (L) >90 mL/min    Comment: (NOTE) The eGFR has been calculated using the CKD EPI equation. This calculation has not been validated in all clinical situations. eGFR's persistently <90 mL/min signify possible Chronic Kidney Disease.    Anion gap 6 5 - 15  Magnesium     Status: None   Collection Time: 08/11/14  2:10 PM  Result Value Ref Range   Magnesium 1.7 1.5 - 2.5 mg/dL  Phosphorus     Status: None   Collection Time: 08/11/14  2:10 PM  Result Value Ref Range   Phosphorus 3.3 2.3 - 4.6 mg/dL  I-stat troponin, ED (not at [facility])     Status: None   Collection Time: 08/11/14  2:17 PM  Result Value Ref Range   Troponin i, poc 0.02 0.00 - 0.08 ng/mL   Comment 3            Comment: Due to the release kinetics of cTnI, a negative result within the first hours of the onset of symptoms does not rule out myocardial infarction with certainty. If myocardial infarction is still suspected, repeat the test at appropriate intervals.    Dg Chest 2 View  08/11/2014   CLINICAL DATA:  Loss of consciousness  EXAM: CHEST  2 VIEW  COMPARISON:  None  FINDINGS: The heart size and mediastinal contours are within normal limits. Both lungs are clear. The visualized skeletal structures are unremarkable.  IMPRESSION: No active cardiopulmonary disease.   Electronically Signed   By: Kerby Moors M.D.   On: 08/11/2014 12:58    ROS: General:no colds or fevers, no weight changes Skin:no rashes or ulcers HEENT:no blurred vision, no congestion CV:see HPI PUL:see HPI GI:no diarrhea constipation or melena, no indigestion GU:no hematuria, no dysuria MS:no joint pain, no claudication Neuro:+ syncope 2-3 sec, no lightheadedness Endo:no diabetes, no thyroid disease   Blood pressure 134/69, pulse 55, temperature 97.9 F (36.6 C), temperature source Oral, resp. rate 12, height _0  (1.702 m), weight 148 lb (67.132 kg), SpO2 99 %. PE:  General:Pleasant affect, NAD Skin:Warm and dry, brisk capillary refill HEENT:normocephalic, sclera clear, mucus membranes moist Neck:supple, no JVD, no bruits  Heart:S1S2  RRR without murmur, gallup, rub or click Lungs:clear without rales, occ rhonchi, no wheezes OMB:TDHR, non tender, + BS, do not palpate liver spleen or masses Ext:1+ lower ext edema, 2+ pedal pulses, 2+ radial pulses Neuro:alert and oriented X 3, MAE, follows commands, + facial symmetry    Assessment/Plan Principal Problem:   Syncope, brief- has loop- on interrogation no arrhthymias.  She has had SVT in Feb.  Some SB now in 48s.  Check orthostatic BP- plan per    Active Problems:   History of loop recorder   Hypokalemia-  Replace and should be on KDUr while on Lasix - she is receiving 60 meq now.    Elevated CR previously 1.78 now 2.02 would hold lasix for now let cr improve may need low dose as outpt.   Recent cellulitis resolved completed antibiotics   Gout     HTN- labile now    [facility] Nurse Practitioner Certified [employer] Pager [phone] or after 5pm or weekends call [phone] 08/11/2014, 4:08 PM   I have seen, examined the patient, and reviewed the above assessment and plan.  Changes to above are made where necessary.  On exam, she is no distress.  ILR interrogation is reviewed and does not reveal any arrhythmias today.  She DOES NOT HAVE AN ICD AND DID NOT RECEIVE ANY SHOCK THERAPY from her device.  She appears dry on exam and has low K with elevated creatinine. I would anticipate overnight observation by Medicine team with gentle hydration, holding Lasix, and repleting K. Telemetry  Electrophysiology/ Cardiology team to see as needed while here. Please call with questions.  Co Sign: Thompson Grayer, MD 08/11/2014 4:32 PM

## 2014-08-11 NOTE — ED Notes (Signed)
Pt was at home, felt a "kick in her chest" (pt has [employer] defibrillator) and immediately lost consciousness.  Her son was standing beside her and was able to lower her to the ground.  Per son, pt was only unconscious for several seconds.  Pt denies sob or chest pain at this time.  EMS arrived 5 minutes later and pt hr was low 50's, pt sbp was below 100 and ekg showed frequent pvc's initially, but 2nd ekg showed no pvcs.  Pt sinus brady at 57 presently.

## 2014-08-12 DIAGNOSIS — F0390 Unspecified dementia without behavioral disturbance: Secondary | ICD-10-CM | POA: Diagnosis present

## 2014-08-12 DIAGNOSIS — Z8673 Personal history of transient ischemic attack (TIA), and cerebral infarction without residual deficits: Secondary | ICD-10-CM

## 2014-08-12 DIAGNOSIS — I10 Essential (primary) hypertension: Secondary | ICD-10-CM | POA: Diagnosis present

## 2014-08-12 DIAGNOSIS — N183 Chronic kidney disease, stage 3 unspecified: Secondary | ICD-10-CM | POA: Diagnosis present

## 2014-08-12 DIAGNOSIS — N179 Acute kidney failure, unspecified: Secondary | ICD-10-CM | POA: Diagnosis present

## 2014-08-12 DIAGNOSIS — E876 Hypokalemia: Secondary | ICD-10-CM | POA: Diagnosis present

## 2014-08-12 DIAGNOSIS — R931 Abnormal findings on diagnostic imaging of heart and coronary circulation: Secondary | ICD-10-CM

## 2014-08-12 LAB — COMPREHENSIVE METABOLIC PANEL
ALT: 10 U/L (ref 0–35)
AST: 36 U/L (ref 0–37)
Albumin: 2.9 g/dL — ABNORMAL LOW (ref 3.5–5.2)
Alkaline Phosphatase: 53 U/L (ref 39–117)
Anion gap: 10 (ref 5–15)
BILIRUBIN TOTAL: 0.5 mg/dL (ref 0.3–1.2)
BUN: 30 mg/dL — ABNORMAL HIGH (ref 6–23)
CHLORIDE: 106 mmol/L (ref 96–112)
CO2: 25 mmol/L (ref 19–32)
CREATININE: 1.68 mg/dL — AB (ref 0.50–1.10)
Calcium: 8.7 mg/dL (ref 8.4–10.5)
GFR calc Af Amer: 32 mL/min — ABNORMAL LOW (ref >90)
GFR calc non Af Amer: 27 mL/min — ABNORMAL LOW (ref >90)
Glucose, Bld: 77 mg/dL (ref 70–99)
Potassium: 4.4 mmol/L (ref 3.5–5.1)
Sodium: 141 mmol/L (ref 135–145)
Total Protein: 6.3 g/dL (ref 6.0–8.3)

## 2014-08-12 LAB — CBC
HEMATOCRIT: 32 % — AB (ref 36.0–46.0)
Hemoglobin: 10.1 g/dL — ABNORMAL LOW (ref 12.0–15.0)
MCH: 23.6 pg — ABNORMAL LOW (ref 26.0–34.0)
MCHC: 31.6 g/dL (ref 30.0–36.0)
MCV: 74.8 fL — AB (ref 78.0–100.0)
PLATELETS: 173 10*3/uL (ref 150–400)
RBC: 4.28 MIL/uL (ref 3.87–5.11)
RDW: 16 % — AB (ref 11.5–15.5)
WBC: 6.2 10*3/uL (ref 4.0–10.5)

## 2014-08-12 MED ORDER — HYDRALAZINE HCL 50 MG PO TABS: 50.0000 mg | ORAL_TABLET | Freq: Three times a day (TID) | ORAL | Status: DC

## 2014-08-12 MED ORDER — ISOSORBIDE MONONITRATE ER 60 MG PO TB24: 60.0000 mg | ORAL_TABLET | Freq: Every day | ORAL | Status: DC

## 2014-08-12 MED ORDER — FUROSEMIDE 20 MG PO TABS: 20.0000 mg | ORAL_TABLET | Freq: Every day | ORAL | Status: DC

## 2014-08-12 MED ORDER — FUROSEMIDE 40 MG PO TABS: 40.0000 mg | ORAL_TABLET | Freq: Every day | ORAL | Status: DC

## 2014-08-12 NOTE — Evaluation (Signed)
Physical Therapy Evaluation Patient Details Name: Katherine Chapman MRN: [id number] DOB: Feb 06, 1931 Today's Date: 08/12/2014   History of Present Illness  81 y.o. female admitted with Generalized swelling, CKD, and elevated troponin.  Clinical Impression  Patient presents with problems listed below.  Patient is able to ambulate with RW with min assist.  Son will provide 24 hour assist.  Further PT needs can be met in next venue of care - HHPT.  Patient to be d/c today.    Follow Up Recommendations Home health PT;Supervision/Assistance - 24 hour    Equipment Recommendations  None recommended by PT    Recommendations for Other Services       Precautions / Restrictions Precautions Precautions: Fall Restrictions Weight Bearing Restrictions: No      Mobility  Bed Mobility Overal bed mobility: Modified Independent                Transfers Overall transfer level: Needs assistance Equipment used: Rolling walker (2 wheeled) Transfers: Sit to/from Stand Sit to Stand: Min assist Stand pivot transfers: Min guard       General transfer comment: Verbal cues for hand placement. Assist to rise to standing and for balance.  Patient with posterior lean.  Ambulation/Gait Ambulation/Gait assistance: Min assist Ambulation Distance (Feet): 32 Feet Assistive device: Rolling walker (2 wheeled) Gait Pattern/deviations: Step-through pattern;Decreased stride length;Shuffle;Trunk flexed;Narrow base of support Gait velocity: decreased Gait velocity interpretation: Below normal speed for age/gender General Gait Details: Verbal cues for safe use of RW.  Assist for balance and to maneuver RW at times (turns).  Slow, shuffling gait.  Stairs            Wheelchair Mobility    Modified Rankin (Stroke Patients Only)       Balance Overall balance assessment: Needs assistance Sitting-balance support: Feet supported Sitting balance-Leahy Scale: Good     Standing balance support: During  functional activity Standing balance-Leahy Scale: Fair                               Pertinent Vitals/Pain Pain Assessment: No/denies pain    Home Living Family/patient expects to be discharged to:: Private residence Living Arrangements: Children Available Help at Discharge: Family;Available 24 hours/day Type of Home: House Home Access: Stairs to enter Entrance Stairs-Rails: Left Entrance Stairs-Number of Steps: 2 front 1 at back Home Layout: Two level;Bed/bath upstairs Home Equipment: Walker - 2 wheels;Cane - single point;Bedside commode;Tub bench Additional Comments: Son reports pt was receiving HHPT at home PTA    Prior Function Level of Independence: Needs assistance   Gait / Transfers Assistance Needed: ambulated with RW with assist  ADL's / Homemaking Assistance Needed: Son reports that he assisted her intermittently with BADLs and was with her when she was standing         Hand Dominance   Dominant Hand: Right    Extremity/Trunk Assessment   Upper Extremity Assessment: Defer to OT evaluation           Lower Extremity Assessment: Generalized weakness         Communication   Communication: No difficulties  Cognition Arousal/Alertness: Awake/alert Behavior During Therapy: WFL for tasks assessed/performed Overall Cognitive Status: History of cognitive impairments - at baseline                      General Comments      Exercises        Assessment/Plan  PT Assessment All further PT needs can be met in the next venue of care  PT Diagnosis Difficulty walking;Abnormality of gait;Generalized weakness   PT Problem List Decreased strength;Decreased balance;Decreased mobility;Decreased cognition;Decreased safety awareness  PT Treatment Interventions     PT Goals (Current goals can be found in the Care Plan section) Acute Rehab PT Goals Patient Stated Goal: Go home     Frequency     Barriers to discharge         Co-evaluation               End of Session Equipment Utilized During Treatment: Gait belt Activity Tolerance: Patient tolerated treatment well Patient left: in chair;with call bell/phone within reach;with chair alarm set Nurse Communication: Mobility status    Functional Assessment Tool Used: Clinical judgement Functional Limitation: Mobility: Walking and moving around Mobility: Walking and Moving Around Current Status ([id number]): At least 20 percent but less than 40 percent impaired, limited or restricted Mobility: Walking and Moving Around Goal Status [phone]): At least 20 percent but less than 40 percent impaired, limited or restricted Mobility: Walking and Moving Around Discharge Status [phone]): At least 20 percent but less than 40 percent impaired, limited or restricted    Time: 1344-1354 PT Time Calculation (min) (ACUTE ONLY): 10 min   Charges:   PT Evaluation $Initial PT Evaluation Tier I: 1 Procedure     PT G Codes:   PT G-Codes **NOT FOR INPATIENT CLASS** Functional Assessment Tool Used: Clinical judgement Functional Limitation: Mobility: Walking and moving around Mobility: Walking and Moving Around Current Status ([id number]): At least 20 percent but less than 40 percent impaired, limited or restricted Mobility: Walking and Moving Around Goal Status [phone]): At least 20 percent but less than 40 percent impaired, limited or restricted Mobility: Walking and Moving Around Discharge Status [phone]): At least 20 percent but less than 40 percent impaired, limited or restricted    Despina Pole 08/12/2014, 2:02 PM  Carita Pian. Sanjuana Kava, [place] Pager [phone]

## 2014-08-12 NOTE — Evaluation (Signed)
Occupational Therapy Evaluation Patient Details Name: Katherine Chapman MRN: [id number] DOB: 08-21-1930 Today's Date: 08/12/2014    History of Present Illness 81 y.o. female admitted with Generalized swelling, CKD, and elevated troponin.   Clinical Impression   Pt admitted with above.  Currently, she requires min guard to min A for BADLs.  Spoke with son who is able to provide 24 hour assist.  He reports this is pt's current baseline status and feels comfortable with continuing with this level of care.  He indicates pt was receiving HHPT PTA, and would like to resume HHPT to continue to address her balance deficits.     Follow Up Recommendations  No OT follow up;Supervision/Assistance - 24 hour Recommend HHPT (pt was receiving HHPT PTA)   Equipment Recommendations  None recommended by OT    Recommendations for Other Services       Precautions / Restrictions Precautions Precautions: Fall Restrictions Weight Bearing Restrictions: No      Mobility Bed Mobility Overal bed mobility: Modified Independent                Transfers Overall transfer level: Needs assistance   Transfers: Sit to/from Stand;Stand Pivot Transfers Sit to Stand: Min assist;Min guard Stand pivot transfers: Min guard       General transfer comment: min A initially - pt loses balance posteriorly, but on second attempt able to stand with min guard assist.     Balance Overall balance assessment: Needs assistance Sitting-balance support: Feet supported Sitting balance-Leahy Scale: Good     Standing balance support: During functional activity Standing balance-Leahy Scale: Fair                              ADL Overall ADL's : At baseline                                       General ADL Comments: Pt peformed bathing with min guard assist and min cues.  She is able to don/doff socks.  Min A sit to stand.  spoke with son who reports this is pt's baseline      Vision      Perception     Praxis      Pertinent Vitals/Pain Pain Assessment: No/denies pain     Hand Dominance Right   Extremity/Trunk Assessment Upper Extremity Assessment Upper Extremity Assessment: Overall WFL for tasks assessed   Lower Extremity Assessment Lower Extremity Assessment: Defer to PT evaluation       Communication Communication Communication: No difficulties   Cognition Arousal/Alertness: Awake/alert Behavior During Therapy: WFL for tasks assessed/performed Overall Cognitive Status: History of cognitive impairments - at baseline                     General Comments       Exercises       Shoulder Instructions      Home Living Family/patient expects to be discharged to:: Private residence Living Arrangements: Children Available Help at Discharge: Family;Available 24 hours/day Type of Home: House Home Access: Stairs to enter [employer] of Steps: 2 front 1 at back Entrance Stairs-Rails: Left Home Layout: Bed/bath upstairs;One level   Alternate Level Stairs-Rails: Left Bathroom Shower/Tub: Tub/shower unit;Curtain Shower/tub characteristics: Engineer, building services: Standard Bathroom Accessibility: Yes How Accessible: Accessible via walker Home Equipment: Walker - 2 wheels;Cane - single point;Bedside commode;Tub  bench   Additional Comments: Son reports pt was receiving HHPT at home PTA      Prior Functioning/Environment Level of Independence: Needs assistance  Gait / Transfers Assistance Needed: ambulated with RW with assist ADL's / Homemaking Assistance Needed: Son reports that he assisted her intermittently with BADLs and was with her when she was standing         OT Diagnosis: Generalized weakness;Cognitive deficits   OT Problem List: Decreased strength;Impaired balance (sitting and/or standing);Decreased cognition   OT Treatment/Interventions:      OT Goals(Current goals can be found in the care plan section) Acute Rehab  OT Goals Patient Stated Goal: Go home  OT Goal Formulation: All assessment and education complete, DC therapy  OT Frequency:     Barriers to D/C:            Co-evaluation              End of Session Equipment Utilized During Treatment: Rolling walker Nurse Communication: Mobility status  Activity Tolerance: Patient tolerated treatment well Patient left: in chair;with call bell/phone within reach;with chair alarm set   Time: 1213-1230 OT Time Calculation (min): 17 min Charges:  OT General Charges $OT Visit: 1 Procedure OT Evaluation $Initial OT Evaluation Tier I: 1 Procedure G-Codes: OT G-codes **NOT FOR INPATIENT CLASS** Functional Limitation: Self care Self Care Current Status (N5621(G8987): At least 1 percent but less than 20 percent impaired, limited or restricted Self Care Goal Status (H0865(G8988): At least 1 percent but less than 20 percent impaired, limited or restricted Self Care Discharge Status [phone]): At least 1 percent but less than 20 percent impaired, limited or restricted  Krystina Strieter M 08/12/2014, 12:49 PM

## 2014-08-12 NOTE — Progress Notes (Signed)
[facility] PROGRESS NOTE  Katherine Chapman ZOX:[id number] DOB: 01/18/1931 DOA: 08/11/2014  PCP: None  Brief HPI:  81 year old African-American female with a history of dementia, stroke, CHF, hypertension, COPD with a loop recorder in place. [place] She presented after a syncopal episode.  Past medical history:  Past Medical History  Diagnosis Date  . CHF (congestive heart failure)   . Hypertension   . History of loop recorder     Consultants: Cardiology  Procedures: TEE is planned for 3/14  Antibiotics: None  Subjective: Patient pleasantly confused. Denies any pain.  Objective: Vital Signs  Filed Vitals:   08/12/14 0500 08/12/14 0513 08/12/14 0557 08/12/14 1421  BP: 159/81 193/80 151/62 124/58  Pulse: 72 61 68 80  Temp:    98.1 F (36.7 C)  TempSrc:    Oral  Resp:    17  Height:      Weight:      SpO2:    100%    Intake/Output Summary (Last 24 hours) at 08/12/14 1601 Last data filed at 08/12/14 1412  Gross per 24 hour  Intake    476 ml  Output      0 ml  Net    476 ml   Filed Weights   08/11/14 1146 08/11/14 1956  Weight: 67.132 kg (148 lb) 69.2 kg (152 lb 8.9 oz)    General appearance: alert, cooperative, appears stated age and no distress Head: Normocephalic, without obvious abnormality, atraumatic Resp: clear to auscultation bilaterally Cardio: regular rate and rhythm, S1, S2 normal, no murmur, click, rub or gallop GI: soft, non-tender; bowel sounds normal; no masses,  no organomegaly Extremities: extremities normal, atraumatic, no cyanosis or edema Neurologic: Awake, alert. Pleasantly confused. No focal deficits.  Lab Results:  Basic Metabolic Panel:  Recent Labs Lab 08/11/14 1410 08/12/14 0405  NA 140 141  K 2.6* 4.4  CL 104 106  CO2 30 25  GLUCOSE 111* 77  BUN 33* 30*  CREATININE 2.02* 1.68*  CALCIUM 8.6 8.7  MG 1.7  --   PHOS 3.3  --    Liver Function Tests:  Recent Labs Lab 08/11/14 1410  08/12/14 0405  AST 35 36  ALT 12 10  ALKPHOS 52 53  BILITOT 0.5 0.5  PROT 6.1 6.3  ALBUMIN 2.8* 2.9*   CBC:  Recent Labs Lab 08/11/14 1410 08/12/14 0405  WBC 6.0 6.2  HGB 9.0* 10.1*  HCT 28.3* 32.0*  MCV 73.1* 74.8*  PLT 193 173     Studies/Results: Dg Chest 2 View  08/11/2014   CLINICAL DATA:  Loss of consciousness  EXAM: CHEST  2 VIEW  COMPARISON:  None  FINDINGS: The heart size and mediastinal contours are within normal limits. Both lungs are clear. The visualized skeletal structures are unremarkable.  IMPRESSION: No active cardiopulmonary disease.   Electronically Signed   By: Signa Kellaylor  Stroud M.D.   On: 08/11/2014 12:58   [place] Renal  08/11/2014   CLINICAL DATA:  Acute onset of renal failure.  Initial encounter.  EXAM: RENAL/URINARY TRACT ULTRASOUND COMPLETE  COMPARISON:  None.  FINDINGS: Right Kidney:  Length: 8.6 cm. Diffusely increased parenchymal echogenicity noted. Two right renal cysts are seen, measuring 2.6 cm and 1.6 cm in size. No hydronephrosis visualized.  Left Kidney:  Length: 8.0 cm. Diffusely increased parenchymal echogenicity noted. A left renal cyst is noted, measuring 1.5 cm in size. No hydronephrosis visualized.  Bladder:  Appears normal for degree of bladder distention.  IMPRESSION: 1.  Diffusely increased renal parenchymal echogenicity raises concern for medical renal disease. 2. No evidence of hydronephrosis. 3. Bilateral renal cysts noted.   Electronically Signed   By: Roanna Raider M.D.   On: 08/11/2014 19:02    Medications:  Scheduled: . allopurinol  100 mg Oral Daily  . aspirin EC  81 mg Oral Daily  . budesonide-formoterol  2 puff Inhalation BID  . carvedilol  25 mg Oral BID WC  . enoxaparin (LOVENOX) injection  30 mg Subcutaneous Q24H  . isosorbide mononitrate  30 mg Oral Daily  . sodium chloride  3 mL Intravenous Q12H   Continuous:  [id number]  Assessment/Plan:  Principal Problem:   Syncope, brief Active Problems:   History of loop  recorder    Syncope Patient was admitted due to syncope. She has a loop recorder in place, which was interrogated by cardiology. No arrhythmias were noted. Orthostatics were not abnormal. It was felt, considering her electrolyte abnormalities, that she may have been dehydrated. Her Lasix was held along with her ACE inhibitor. She was gently hydrated. Potassium was corrected. Her creatinine which was elevated also responded to these interventions.  Abnormal echocardiogram She had an echocardiogram last month when she was hospitalized for fluid overload. Report is as below:  Study Conclusions - Left ventricle: The cavity size was normal. There was mildconcentric hypertrophy. Systolic function was normal. Theestimated ejection fraction was in the range of 60% to 65%. Wallmotion was normal; there were no regional wall motionabnormalities. There was an increased relative contribution ofatrial contraction to ventricular filling. Doppler parameters areconsistent with abnormal left ventricular relaxation (grade 1diastolic dysfunction). - Aortic valve: Trileaflet; mildly calcified leaflets. Mildthickening, consistent with sclerosis. There was mildregurgitation. - Mitral valve: There was mild regurgitation. - Right atrium: There is a large mobile density that appears toextend from the wall of the RA just distal to the TV into the RAcavity of unknown etiology. The patient has a history ofreportedly having an ICD but no pacer wire noted in the RV onecho. Recommend Cardiac CT for further evaluation. - Pulmonary arteries: PA peak pressure: 40 mm Hg (S).  Unclear what this mobile density could be or whether it could've contributing to her syncope. Due to her chronic kidney disease she cannot have a cardiac CT. Discussed with Dr. Diona Browner with cardiology. TEE would be the next optimal study. This will be arranged for Monday.  Acute on chronic kidney disease stage III Renal function has improved with  gentle hydration. Continue to monitor urine output. Continue to hold her Lasix and lisinopril.  Hypokalemia Now in normal range.  Essential hypertension Continue Coreg and MDR. Increase the dose of MDR and had hydralazine. Her ACE inhibitor will be discontinued.  History of for dementia Currently stable. No behavioral issues.  History of stroke previously Stable. Not an active issue  History of COPD Stable. Not an active issue.  Moderate protein calorie malnutrition Encourage oral intake.  DVT Prophylaxis: Lovenox    Code Status: Full code  Family Communication: Discussed with her son  Disposition Plan: Not ready for discharge      [facility]  [facility] Pager [phone] 08/12/2014, 4:01 PM  If 7PM-7AM, please contact night-coverage at [email or web address], password [facility]

## 2014-08-12 NOTE — Discharge Instructions (Signed)
Syncope °Syncope is a medical term for fainting or passing out. This means you lose consciousness and drop to the ground. People are generally unconscious for less than 5 minutes. You may have some muscle twitches for up to 15 seconds before waking up and returning to normal. Syncope occurs more often in older adults, but it can happen to anyone. While most causes of syncope are not dangerous, syncope can be a sign of a serious medical problem. It is important to seek medical care.  °CAUSES  °Syncope is caused by a sudden drop in blood flow to the brain. The specific cause is often not determined. Factors that can bring on syncope include: °· Taking medicines that lower blood pressure. °· Sudden changes in posture, such as standing up quickly. °· Taking more medicine than prescribed. °· Standing in one place for too long. °· Seizure disorders. °· Dehydration and excessive exposure to heat. °· Low blood sugar (hypoglycemia). °· Straining to have a bowel movement. °· Heart disease, irregular heartbeat, or other circulatory problems. °· Fear, emotional distress, seeing blood, or severe pain. °SYMPTOMS  °Right before fainting, you may: °· Feel dizzy or light-headed. °· Feel nauseous. °· See all white or all black in your field of vision. °· Have cold, clammy skin. °DIAGNOSIS  °Your health care provider will ask about your symptoms, perform a physical exam, and perform an electrocardiogram (ECG) to record the electrical activity of your heart. Your health care provider may also perform other heart or blood tests to determine the cause of your syncope which may include: °· Transthoracic echocardiogram (TTE). During echocardiography, sound waves are used to evaluate how blood flows through your heart. °· Transesophageal echocardiogram (TEE). °· Cardiac monitoring. This allows your health care provider to monitor your heart rate and rhythm in real time. °· Holter monitor. This is a portable device that records your  heartbeat and can help diagnose heart arrhythmias. It allows your health care provider to track your heart activity for several days, if needed. °· Stress tests by exercise or by giving medicine that makes the heart beat faster. °TREATMENT  °In most cases, no treatment is needed. Depending on the cause of your syncope, your health care provider may recommend changing or stopping some of your medicines. °HOME CARE INSTRUCTIONS °· Have someone stay with you until you feel stable. °· Do not drive, use machinery, or play sports until your health care provider says it is okay. °· Keep all follow-up appointments as directed by your health care provider. °· Lie down right away if you start feeling like you might faint. Breathe deeply and steadily. Wait until all the symptoms have passed. °· Drink enough fluids to keep your urine clear or pale yellow. °· If you are taking blood pressure or heart medicine, get up slowly and take several minutes to sit and then stand. This can reduce dizziness. °SEEK IMMEDIATE MEDICAL CARE IF:  °· You have a severe headache. °· You have unusual pain in the chest, abdomen, or back. °· You are bleeding from your mouth or rectum, or you have black or tarry stool. °· You have an irregular or very fast heartbeat. °· You have pain with breathing. °· You have repeated fainting or seizure-like jerking during an episode. °· You faint when sitting or lying down. °· You have confusion. °· You have trouble walking. °· You have severe weakness. °· You have vision problems. °If you fainted, call your local emergency services (911 in [place] Do not drive   yourself to the hospital.  °MAKE SURE YOU: °· Understand these instructions. °· Will watch your condition. °· Will get help right away if you are not doing well or get worse. °Document Released: 05/19/2005 Document Revised: 05/24/2013 Document Reviewed: 07/18/2011 °Exit[employer]® Patient Information ©2015 [employer]. This information is not intended to replace  advice given to you by your health care provider. Make sure you discuss any questions you have with your health care provider. ° °

## 2014-08-12 NOTE — Care Management Note (Addendum)
    Page 1 of 1   08/12/2014     4:11:50 PM CARE MANAGEMENT NOTE 08/12/2014  Patient:  Katherine Chapman,Katherine Chapman   Account Number:  [id number]  Date Initiated:  08/12/2014  Documentation initiated by:  [facility]Lataysha Vohra  Subjective/Objective Assessment:   adm: Generalized swelling     Action/Plan:   discharge planning   Anticipated DC Date:  08/12/2014   Anticipated DC Plan:  HOME W HOME HEALTH SERVICES      DC Planning Services  CM consult  PCP issues      [facility]  [facility] Provider   [facility] offered to / List presented [facility]             [facility]  [facility]   Status of service:  Completed, signed off Medicare Important Message given?   (If response is "NO", the following Medicare IM given date fields will be blank) Date Medicare IM given:   Medicare IM given by:   Date Additional Medicare IM given:   Additional Medicare IM given by:    Discharge Disposition:  HOME W HOME HEALTH SERVICES  Per UR Regulation:    If discussed at Long Length of Stay Meetings, dates discussed:    Comments:  08/12/14 13:30 CM has given pt HEALTH CONNECT NUMBER (as I did her last admission) with instruction to call on Monday to seure a PCP.  Pt is active with [facility] and has resumption order sor HHPT/OT.  CM called [facility] rep, Katherine Chapman to please resume HH services.  No other CM needs were communicated. Katherine Chapman, BSN, CM [phone].

## 2014-08-12 NOTE — Progress Notes (Signed)
UR completed 

## 2014-08-12 NOTE — Care Management Note (Signed)
Date Additional Medicare IM given:  08/12/14  Additional Medicare IM given by:  Sharmila Wrobleski, RN, BSN, CCM   

## 2014-08-13 LAB — CBC
HCT: 29.6 % — ABNORMAL LOW (ref 36.0–46.0)
Hemoglobin: 9.3 g/dL — ABNORMAL LOW (ref 12.0–15.0)
MCH: 23.3 pg — AB (ref 26.0–34.0)
MCHC: 31.4 g/dL (ref 30.0–36.0)
MCV: 74.2 fL — ABNORMAL LOW (ref 78.0–100.0)
Platelets: 153 10*3/uL (ref 150–400)
RBC: 3.99 MIL/uL (ref 3.87–5.11)
RDW: 15.8 % — ABNORMAL HIGH (ref 11.5–15.5)
WBC: 5.8 10*3/uL (ref 4.0–10.5)

## 2014-08-13 LAB — BASIC METABOLIC PANEL
Anion gap: 6 (ref 5–15)
BUN: 29 mg/dL — ABNORMAL HIGH (ref 6–23)
CALCIUM: 8.7 mg/dL (ref 8.4–10.5)
CHLORIDE: 105 mmol/L (ref 96–112)
CO2: 26 mmol/L (ref 19–32)
Creatinine, Ser: 1.75 mg/dL — ABNORMAL HIGH (ref 0.50–1.10)
GFR, EST AFRICAN AMERICAN: 30 mL/min — AB (ref >90)
GFR, EST NON AFRICAN AMERICAN: 26 mL/min — AB (ref >90)
GLUCOSE: 96 mg/dL (ref 70–99)
POTASSIUM: 4 mmol/L (ref 3.5–5.1)
Sodium: 137 mmol/L (ref 135–145)

## 2014-08-13 MED ORDER — HYDRALAZINE HCL 50 MG PO TABS
50.0000 mg | ORAL_TABLET | Freq: Three times a day (TID) | ORAL | Status: DC
  Administered 2014-08-13 – 2014-08-15 (×7): 50 mg via ORAL
  Filled 2014-08-13 (×9): qty 1

## 2014-08-13 MED ORDER — ISOSORBIDE MONONITRATE ER 60 MG PO TB24
60.0000 mg | ORAL_TABLET | Freq: Every day | ORAL | Status: DC
  Administered 2014-08-13 – 2014-08-15 (×3): 60 mg via ORAL
  Filled 2014-08-13 (×4): qty 1

## 2014-08-13 NOTE — Progress Notes (Signed)
 [facility] PROGRESS NOTE  Tamirah Mckinlay MRN:[id number] DOB: 04/16/1931 DOA: 08/11/2014  PCP: None  Brief HPI:  81-year-old African-American female with a history of dementia, stroke, CHF, hypertension, COPD with a loop recorder in place. Recently moved from [place]. She presented after a syncopal episode.  Past medical history:  Past Medical History  Diagnosis Date  . CHF (congestive heart failure)   . Hypertension   . History of loop recorder     Consultants: Cardiology  Procedures: TEE is planned for 3/14  Antibiotics: None  Subjective: Patient pleasantly confused. Denies any pain. No complaints offered.  Objective: Vital Signs  Filed Vitals:   08/12/14 1957 08/12/14 2112 08/13/14 0705 08/13/14 0708  BP: 156/75  203/93 170/71  Pulse: 72  65   Temp: 97.6 F (36.4 C)  98.1 F (36.7 C)   TempSrc: Oral  Oral   Resp: 18  16   Height:      Weight:      SpO2: 100% 100% 100%     Intake/Output Summary (Last 24 hours) at 08/13/14 1015 Last data filed at 08/13/14 0744  Gross per 24 hour  Intake    716 ml  Output      0 ml  Net    716 ml   Filed Weights   08/11/14 1146 08/11/14 1956  Weight: 67.132 kg (148 lb) 69.2 kg (152 lb 8.9 oz)    General appearance: alert, cooperative, appears stated age and no distress Resp: clear to auscultation bilaterally Cardio: regular rate and rhythm, S1, S2 normal, no murmur, click, rub or gallop GI: soft, non-tender; bowel sounds normal; no masses,  no organomegaly Neurologic: Awake, alert. Pleasantly confused. No focal deficits.  Lab Results:  Basic Metabolic Panel:  Recent Labs Lab 08/11/14 1410 08/12/14 0405 08/13/14 0515  NA 140 141 137  K 2.6* 4.4 4.0  CL 104 106 105  CO2 30 25 26  GLUCOSE 111* 77 96  BUN 33* 30* 29*  CREATININE 2.02* 1.68* 1.75*  CALCIUM 8.6 8.7 8.7  MG 1.7  --   --   PHOS 3.3  --   --    Liver Function Tests:  Recent Labs Lab 08/11/14 1410 08/12/14 0405  AST 35 36  ALT 12 10    ALKPHOS 52 53  BILITOT 0.5 0.5  PROT 6.1 6.3  ALBUMIN 2.8* 2.9*   CBC:  Recent Labs Lab 08/11/14 1410 08/12/14 0405 08/13/14 0515  WBC 6.0 6.2 5.8  HGB 9.0* 10.1* 9.3*  HCT 28.3* 32.0* 29.6*  MCV 73.1* 74.8* 74.2*  PLT 193 173 153     Studies/Results: Dg Chest 2 View  08/11/2014   CLINICAL DATA:  Loss of consciousness  EXAM: CHEST  2 VIEW  COMPARISON:  None  FINDINGS: The heart size and mediastinal contours are within normal limits. Both lungs are clear. The visualized skeletal structures are unremarkable.  IMPRESSION: No active cardiopulmonary disease.   Electronically Signed   By: Taylor  Stroud M.D.   On: 08/11/2014 12:58   Us Renal  08/11/2014   CLINICAL DATA:  Acute onset of renal failure.  Initial encounter.  EXAM: RENAL/URINARY TRACT ULTRASOUND COMPLETE  COMPARISON:  None.  FINDINGS: Right Kidney:  Length: 8.6 cm. Diffusely increased parenchymal echogenicity noted. Two right renal cysts are seen, measuring 2.6 cm and 1.6 cm in size. No hydronephrosis visualized.  Left Kidney:  Length: 8.0 cm. Diffusely increased parenchymal echogenicity noted. A left renal cyst is noted, measuring 1.5 cm in size.   No hydronephrosis visualized.  Bladder:  Appears normal for degree of bladder distention.  IMPRESSION: 1. Diffusely increased renal parenchymal echogenicity raises concern for medical renal disease. 2. No evidence of hydronephrosis. 3. Bilateral renal cysts noted.   Electronically Signed   By: Jeffery  Chang M.D.   On: 08/11/2014 19:02    Medications:  Scheduled: . allopurinol  100 mg Oral Daily  . aspirin EC  81 mg Oral Daily  . budesonide-formoterol  2 puff Inhalation BID  . carvedilol  25 mg Oral BID WC  . enoxaparin (LOVENOX) injection  30 mg Subcutaneous Q24H  . hydrALAZINE  50 mg Oral 3 times per day  . isosorbide mononitrate  60 mg Oral Daily  . sodium chloride  3 mL Intravenous Q12H   Continuous:  PRN:hydrALAZINE  Assessment/Plan:  Principal Problem:   Syncope,  brief Active Problems:   History of loop recorder   Essential hypertension   Chronic kidney disease, stage III (moderate)   Acute renal failure   Dementia   History of stroke   Hypokalemia    Syncope Patient was admitted due to syncope. She has a loop recorder in place, which was interrogated by cardiology. No arrhythmias were noted. Orthostatics were not abnormal. It was felt, considering her electrolyte abnormalities, that she may have been dehydrated. Her Lasix was held along with her ACE inhibitor. She was gently hydrated. Potassium was corrected. Her creatinine which was elevated also responded to these interventions. Unclear of the finding mentioned below played a role.  Abnormal echocardiogram She had an echocardiogram last month when she was hospitalized for fluid overload. Report is as below:  ECHO Feb 2016 Study Conclusions - Left ventricle: The cavity size was normal. There was mildconcentric hypertrophy. Systolic function was normal. Theestimated ejection fraction was in the range of 60% to 65%. Wallmotion was normal; there were no regional wall motionabnormalities. There was an increased relative contribution ofatrial contraction to ventricular filling. Doppler parameters areconsistent with abnormal left ventricular relaxation (grade 1diastolic dysfunction). - Aortic valve: Trileaflet; mildly calcified leaflets. Mildthickening, consistent with sclerosis. There was mildregurgitation. - Mitral valve: There was mild regurgitation. - Right atrium: There is a large mobile density that appears toextend from the wall of the RA just distal to the TV into the RAcavity of unknown etiology. The patient has a history ofreportedly having an ICD but no pacer wire noted in the RV onecho. Recommend Cardiac CT for further evaluation. - Pulmonary arteries: PA peak pressure: 40 mm Hg (S).  Unclear what this mobile density could be or whether it could've contributing to her syncope.  Due to her chronic kidney disease she cannot have a cardiac CT. Discussed with Dr. McDowell with cardiology. TEE would be the next optimal study. This will be arranged for Monday.  Acute on chronic kidney disease stage III Renal function has improved with gentle hydration. Continue to monitor urine output. Continue to hold her Lasix and lisinopril.  Hypokalemia Now in normal range.  Essential hypertension Blood pressure remains high. Dose of nitrate increase. Hydralazine was added. Her ACE inhibitor will be discontinued due to renal failure.  History of dementia Currently stable. No behavioral issues.  History of stroke previously Stable. Not an active issue  History of COPD Stable. Not an active issue.  Moderate protein calorie malnutrition Encourage oral intake.  DVT Prophylaxis: Lovenox    Code Status: Full code  Family Communication: No family at bedside Disposition Plan: TEE tomorrow. Will return home when workup is completed. Home health   has been arranged.      Icie Kuznicki  [facility] Pager [phone] 08/13/2014, 10:15 AM  If 7PM-7AM, please contact night-coverage at [email or web address], password [email or web address]   

## 2014-08-13 NOTE — Progress Notes (Signed)
Utilization review completed.  P.J. Orva Riles,RN,BSN Case Manager 

## 2014-08-13 NOTE — Progress Notes (Signed)
Pt's family has not been present today to complete admission documentation. Will continue to monitor.

## 2014-08-13 NOTE — Progress Notes (Signed)
    [facility] has been requested to perform a transesophageal echocardiogram on Katherine Chapman for evaluation of large, mobile density seen in the RA during echo.  After careful review of history and examination, the risks and benefits of transesophageal echocardiogram have been explained including risks of esophageal damage, perforation (1:10,000 risk), bleeding, pharyngeal hematoma as well as other potential complications associated with conscious sedation including aspiration, arrhythmia, respiratory failure and death. Alternatives to treatment were discussed, questions were answered. Patient is willing to proceed.   Wilburt FinlayHAGER, Levonne Carreras, PA-C 08/13/2014 7:34 AM

## 2014-08-13 NOTE — Progress Notes (Signed)
Pt's family has not been present today to complete admission documentation. Will continue to monitor.  

## 2014-08-14 ENCOUNTER — Encounter (HOSPITAL_COMMUNITY)
Admission: EM | Disposition: A | Discharge status: Home / Self Care | Payer: Self-pay | Source: Home / Self Care | Attending: Emergency Medicine

## 2014-08-14 ENCOUNTER — Encounter (HOSPITAL_COMMUNITY): Payer: Self-pay | Admitting: *Deleted

## 2014-08-14 DIAGNOSIS — I351 Nonrheumatic aortic (valve) insufficiency: Secondary | ICD-10-CM

## 2014-08-14 HISTORY — PX: TEE WITHOUT CARDIOVERSION: SHX5443

## 2014-08-14 LAB — BASIC METABOLIC PANEL
ANION GAP: 5 (ref 5–15)
BUN: 29 mg/dL — AB (ref 6–23)
CALCIUM: 8.5 mg/dL (ref 8.4–10.5)
CHLORIDE: 108 mmol/L (ref 96–112)
CO2: 26 mmol/L (ref 19–32)
CREATININE: 1.75 mg/dL — AB (ref 0.50–1.10)
GFR, EST AFRICAN AMERICAN: 30 mL/min — AB (ref >90)
GFR, EST NON AFRICAN AMERICAN: 26 mL/min — AB (ref >90)
Glucose, Bld: 88 mg/dL (ref 70–99)
Potassium: 4.1 mmol/L (ref 3.5–5.1)
Sodium: 139 mmol/L (ref 135–145)

## 2014-08-14 SURGERY — ECHOCARDIOGRAM, TRANSESOPHAGEAL
Anesthesia: Moderate Sedation

## 2014-08-14 MED ORDER — MIDAZOLAM HCL 10 MG/2ML IJ SOLN
INTRAMUSCULAR | Status: DC | PRN
  Administered 2014-08-14 (×2): 2 mg via INTRAVENOUS

## 2014-08-14 MED ORDER — SODIUM CHLORIDE 0.9 % IV SOLN
INTRAVENOUS | Status: DC
  Administered 2014-08-14: 500 mL via INTRAVENOUS

## 2014-08-14 MED ORDER — BUTAMBEN-TETRACAINE-BENZOCAINE 2-2-14 % EX AERO
INHALATION_SPRAY | CUTANEOUS | Status: DC | PRN
  Administered 2014-08-14 (×2): 1 via TOPICAL

## 2014-08-14 MED ORDER — FENTANYL CITRATE 0.05 MG/ML IJ SOLN
INTRAMUSCULAR | Status: AC
  Filled 2014-08-14: qty 2

## 2014-08-14 MED ORDER — FENTANYL CITRATE 0.05 MG/ML IJ SOLN
INTRAMUSCULAR | Status: DC | PRN
  Administered 2014-08-14 (×2): 25 ug via INTRAVENOUS

## 2014-08-14 MED ORDER — MIDAZOLAM HCL 5 MG/ML IJ SOLN
INTRAMUSCULAR | Status: AC
  Filled 2014-08-14: qty 1

## 2014-08-14 NOTE — H&P (View-Only) (Signed)
[facility] PROGRESS NOTE  Katherine Chapman WUJ:[id number] DOB: [birth year] DOA: 08/11/2014  PCP: None  Brief HPI:  81 year old African-American female with a history of dementia, stroke, CHF, hypertension, COPD with a loop recorder in place. Recently moved from [place]. She presented after a syncopal episode.  Past medical history:  Past Medical History  Diagnosis Date  . CHF (congestive heart failure)   . Hypertension   . History of loop recorder     Consultants: Cardiology  Procedures: TEE is planned for 3/14  Antibiotics: None  Subjective: Patient pleasantly confused. Denies any pain. No complaints offered.  Objective: Vital Signs  Filed Vitals:   08/12/14 1957 08/12/14 2112 08/13/14 0705 08/13/14 0708  BP: 156/75  203/93 170/71  Pulse: 72  65   Temp: 97.6 F (36.4 C)  98.1 F (36.7 C)   TempSrc: Oral  Oral   Resp: 18  16   Height:      Weight:      SpO2: 100% 100% 100%     Intake/Output Summary (Last 24 hours) at 08/13/14 1015 Last data filed at 08/13/14 0744  Gross per 24 hour  Intake    716 ml  Output      0 ml  Net    716 ml   Filed Weights   08/11/14 1146 08/11/14 1956  Weight: 67.132 kg (148 lb) 69.2 kg (152 lb 8.9 oz)    General appearance: alert, cooperative, appears stated age and no distress Resp: clear to auscultation bilaterally Cardio: regular rate and rhythm, S1, S2 normal, no murmur, click, rub or gallop GI: soft, non-tender; bowel sounds normal; no masses,  no organomegaly Neurologic: Awake, alert. Pleasantly confused. No focal deficits.  Lab Results:  Basic Metabolic Panel:  Recent Labs Lab 08/11/14 1410 08/12/14 0405 08/13/14 0515  NA 140 141 137  K 2.6* 4.4 4.0  CL 104 106 105  CO2 GLUCOSE 111* 77 96  BUN 33* 30* 29*  CREATININE 2.02* 1.68* 1.75*  CALCIUM 8.6 8.7 8.7  MG 1.7  --   --   PHOS 3.3  --   --    Liver Function Tests:  Recent Labs Lab 08/11/14 1410 08/12/14 0405  AST 35 36  ALT 12 10    ALKPHOS 52 53  BILITOT 0.5 0.5  PROT 6.1 6.3  ALBUMIN 2.8* 2.9*   CBC:  Recent Labs Lab 08/11/14 1410 08/12/14 0405 08/13/14 0515  WBC 6.0 6.2 5.8  HGB 9.0* 10.1* 9.3*  HCT 28.3* 32.0* 29.6*  MCV 73.1* 74.8* 74.2*  PLT 193 173 153     Studies/Results: Dg Chest 2 View  08/11/2014   CLINICAL DATA:  Loss of consciousness  EXAM: CHEST  2 VIEW  COMPARISON:  None  FINDINGS: The heart size and mediastinal contours are within normal limits. Both lungs are clear. The visualized skeletal structures are unremarkable.  IMPRESSION: No active cardiopulmonary disease.   Electronically Signed   By: Signa Kell M.D.   On: 08/11/2014 12:58   US Renal  08/11/2014   CLINICAL DATA:  Acute onset of renal failure.  Initial encounter.  EXAM: RENAL/URINARY TRACT ULTRASOUND COMPLETE  COMPARISON:  None.  FINDINGS: Right Kidney:  Length: 8.6 cm. Diffusely increased parenchymal echogenicity noted. Two right renal cysts are seen, measuring 2.6 cm and 1.6 cm in size. No hydronephrosis visualized.  Left Kidney:  Length: 8.0 cm. Diffusely increased parenchymal echogenicity noted. A left renal cyst is noted, measuring 1.5 cm in size.  No hydronephrosis visualized.  Bladder:  Appears normal for degree of bladder distention.  IMPRESSION: 1. Diffusely increased renal parenchymal echogenicity raises concern for medical renal disease. 2. No evidence of hydronephrosis. 3. Bilateral renal cysts noted.   Electronically Signed   By: Roanna RaiderJeffery  Chang M.D.   On: 08/11/2014 19:02    Medications:  Scheduled: . allopurinol  100 mg Oral Daily  . aspirin EC  81 mg Oral Daily  . budesonide-formoterol  2 puff Inhalation BID  . carvedilol  25 mg Oral BID WC  . enoxaparin (LOVENOX) injection  30 mg Subcutaneous Q24H  . hydrALAZINE  50 mg Oral 3 times per day  . isosorbide mononitrate  60 mg Oral Daily  . sodium chloride  3 mL Intravenous Q12H   Continuous:  WUJ:WJXBJYNWGNFPRN:hydrALAZINE  Assessment/Plan:  Principal Problem:   Syncope,  brief Active Problems:   History of loop recorder   Essential hypertension   Chronic kidney disease, stage III (moderate)   Acute renal failure   Dementia   History of stroke   Hypokalemia    Syncope Patient was admitted due to syncope. She has a loop recorder in place, which was interrogated by cardiology. No arrhythmias were noted. Orthostatics were not abnormal. It was felt, considering her electrolyte abnormalities, that she may have been dehydrated. Her Lasix was held along with her ACE inhibitor. She was gently hydrated. Potassium was corrected. Her creatinine which was elevated also responded to these interventions. Unclear of the finding mentioned below played a role.  Abnormal echocardiogram She had an echocardiogram last month when she was hospitalized for fluid overload. Report is as below:  ECHO Feb 2016 Study Conclusions - Left ventricle: The cavity size was normal. There was mildconcentric hypertrophy. Systolic function was normal. Theestimated ejection fraction was in the range of 60% to 65%. Wallmotion was normal; there were no regional wall motionabnormalities. There was an increased relative contribution ofatrial contraction to ventricular filling. Doppler parameters areconsistent with abnormal left ventricular relaxation (grade 1diastolic dysfunction). - Aortic valve: Trileaflet; mildly calcified leaflets. Mildthickening, consistent with sclerosis. There was mildregurgitation. - Mitral valve: There was mild regurgitation. - Right atrium: There is a large mobile density that appears toextend from the wall of the RA just distal to the TV into the RAcavity of unknown etiology. The patient has a history ofreportedly having an ICD but no pacer wire noted in the RV onecho. Recommend Cardiac CT for further evaluation. - Pulmonary arteries: PA peak pressure: 40 mm Hg (S).  Unclear what this mobile density could be or whether it could've contributing to her syncope.  Due to her chronic kidney disease she cannot have a cardiac CT. Discussed with Dr. Diona BrownerMcDowell with cardiology. TEE would be the next optimal study. This will be arranged for Monday.  Acute on chronic kidney disease stage III Renal function has improved with gentle hydration. Continue to monitor urine output. Continue to hold her Lasix and lisinopril.  Hypokalemia Now in normal range.  Essential hypertension Blood pressure remains high. Dose of nitrate increase. Hydralazine was added. Her ACE inhibitor will be discontinued due to renal failure.  History of dementia Currently stable. No behavioral issues.  History of stroke previously Stable. Not an active issue  History of COPD Stable. Not an active issue.  Moderate protein calorie malnutrition Encourage oral intake.  DVT Prophylaxis: Lovenox    Code Status: Full code  Family Communication: No family at bedside Disposition Plan: TEE tomorrow. Will return home when workup is completed. Home health  has been arranged.      [facility]  [facility] Pager [phone] 08/13/2014, 10:15 AM  If 7PM-7AM, please contact night-coverage at [email or web address], password [facility]

## 2014-08-14 NOTE — Progress Notes (Signed)
PT Cancellation Note  Patient Details Name: Katherine Chapman MRN: [id number] DOB: 09/27/1930   Cancelled Treatment:    Reason Eval/Treat Not Completed: Patient at procedure or test/unavailable;Fatigue/lethargy limiting ability to participate.  Attempted to see patient x2 today.  Patient in procedure in am.  In pm, patient declined due to fatigue.  Will return tomorrow for PT session.  Patient did not d/c 3/12 - for TEE today.  Will set PT goals   Vena AustriaDavis, Emmersyn Kratzke H 08/14/2014, 3:51 PM

## 2014-08-14 NOTE — CV Procedure (Signed)
See full TEE report in [employer]; normal LV function; no RA mass. Olga MillersBrian Maliya Marich

## 2014-08-14 NOTE — Progress Notes (Signed)
[facility] PROGRESS NOTE  Katherine Chapman ZOX:[id number] DOB: 09/30/1930 DOA: 08/11/2014  PCP: None  Brief HPI:  81 year old African-American female with a history of dementia, stroke, CHF, hypertension, COPD with a loop recorder in place. [place] moved [place] She presented after a syncopal episode.  Past medical history:  Past Medical History  Diagnosis Date  . CHF (congestive heart failure)   . Hypertension   . History of loop recorder     Consultants: Cardiology  Procedures: TEE is planned for 3/14  Antibiotics: None  Subjective: Patient pleasantly confused. No complaints offered.  Objective: Vital Signs  Filed Vitals:   08/13/14 1341 08/13/14 2112 08/14/14 0534 08/14/14 0915  BP: 136/84 135/61 177/70   Pulse: 67 82 65   Temp: 97.3 F (36.3 C) 98.3 F (36.8 C) 98 F (36.7 C)   TempSrc: Oral Oral Oral   Resp: 16 18 18    Height:      Weight:      SpO2: 100% 100% 100% 100%    Intake/Output Summary (Last 24 hours) at 08/14/14 0959 Last data filed at 08/13/14 1826  Gross per 24 hour  Intake    576 ml  Output      0 ml  Net    576 ml   Filed Weights   08/11/14 1146 08/11/14 1956  Weight: 67.132 kg (148 lb) 69.2 kg (152 lb 8.9 oz)    General appearance: alert, cooperative, appears stated age and no distress Resp: clear to auscultation bilaterally Cardio: regular rate and rhythm, S1, S2 normal, no murmur, click, rub or gallop GI: soft, non-tender; bowel sounds normal; no masses,  no organomegaly Neurologic: Awake, alert. Pleasantly confused. No focal deficits.  Lab Results:  Basic Metabolic Panel:  Recent Labs Lab 08/11/14 1410 08/12/14 0405 08/13/14 0515 08/14/14 0530  NA 140 141 137 139  K 2.6* 4.4 4.0 4.1  CL 104 106 105 108  CO2 30 25 26 26   GLUCOSE 111* 77 96 88  BUN 33* 30* 29* 29*  CREATININE 2.02* 1.68* 1.75* 1.75*  CALCIUM 8.6 8.7 8.7 8.5  MG 1.7  --   --   --   PHOS 3.3  --   --   --    Liver Function  Tests:  Recent Labs Lab 08/11/14 1410 08/12/14 0405  AST 35 36  ALT 12 10  ALKPHOS 52 53  BILITOT 0.5 0.5  PROT 6.1 6.3  ALBUMIN 2.8* 2.9*   CBC:  Recent Labs Lab 08/11/14 1410 08/12/14 0405 08/13/14 0515  WBC 6.0 6.2 5.8  HGB 9.0* 10.1* 9.3*  HCT 28.3* 32.0* 29.6*  MCV 73.1* 74.8* 74.2*  PLT 193 173 153     Studies/Results: No results found.  Medications:  Scheduled: . allopurinol  100 mg Oral Daily  . aspirin EC  81 mg Oral Daily  . budesonide-formoterol  2 puff Inhalation BID  . carvedilol  25 mg Oral BID WC  . enoxaparin (LOVENOX) injection  30 mg Subcutaneous Q24H  . hydrALAZINE  50 mg Oral 3 times per day  . isosorbide mononitrate  60 mg Oral Daily  . sodium chloride  3 mL Intravenous Q12H   Continuous:  WJX:BJYNWGNFAOZPRN:hydrALAZINE  Assessment/Plan:  Principal Problem:   Syncope, brief Active Problems:   History of loop recorder   Essential hypertension   Chronic kidney disease, stage III (moderate)   Acute renal failure   Dementia   History of stroke   Hypokalemia    Syncope Patient was  admitted due to syncope. She has a loop recorder in place, which was interrogated by cardiology. No arrhythmias were noted. Orthostatics were not abnormal. It was felt, considering her electrolyte abnormalities, that she may have been dehydrated. Her Lasix was held along with her ACE inhibitor. She was gently hydrated. Potassium was corrected. Her creatinine which was elevated also responded to these interventions. Unclear if the finding mentioned below played a role.  Abnormal echocardiogram She had an echocardiogram last month when she was hospitalized for fluid overload. Report is as below:  ECHO Feb 2016 Study Conclusions - Left ventricle: The cavity size was normal. There was mildconcentric hypertrophy. Systolic function was normal. Theestimated ejection fraction was in the range of 60% to 65%. Wallmotion was normal; there were no regional wall  motionabnormalities. There was an increased relative contribution ofatrial contraction to ventricular filling. Doppler parameters areconsistent with abnormal left ventricular relaxation (grade 1diastolic dysfunction). - Aortic valve: Trileaflet; mildly calcified leaflets. Mildthickening, consistent with sclerosis. There was mildregurgitation. - Mitral valve: There was mild regurgitation. - Right atrium: There is a large mobile density that appears toextend from the wall of the RA just distal to the TV into the RAcavity of unknown etiology. The patient has a history ofreportedly having an ICD but no pacer wire noted in the RV onecho. Recommend Cardiac CT for further evaluation. - Pulmonary arteries: PA peak pressure: 40 mm Hg (S).  Unclear what this mobile density could be or whether it could've contributing to her syncope. Due to her chronic kidney disease she cannot have a cardiac CT. Discussed with cardiology. TEE would be the next optimal study. This will be done today.  Acute on chronic kidney disease stage III Renal function has improved with gentle hydration. Continue to monitor urine output. Continue to hold her Lasix and lisinopril.  Hypokalemia Now in normal range.  Essential hypertension Blood pressure is better controlled. Dose of nitrate was increased. Hydralazine was added. Her ACE inhibitor will be discontinued due to renal failure.  History of dementia Currently stable. No behavioral issues.  History of stroke previously Stable. Not an active issue  History of COPD Stable. Not an active issue.  Moderate protein calorie malnutrition Encourage oral intake.  DVT Prophylaxis: Lovenox    Code Status: Full code  Family Communication: No family at bedside Disposition Plan: TEE today. Will return home when workup is completed. Home health has been arranged.   [facility]  [facility] Pager [phone] 08/14/2014, 9:59 AM  If 7PM-7AM, please contact  night-coverage at [email or web address], password [email or web address]

## 2014-08-14 NOTE — Interval H&P Note (Signed)
History and Physical Interval Note:  08/14/2014 12:10 PM  Katherine Chapman  has presented today for surgery, with the diagnosis of r/o thrombus  The various methods of treatment have been discussed with the patient and family. After consideration of risks, benefits and other options for treatment, the patient has consented to  Procedure(s): TRANSESOPHAGEAL ECHOCARDIOGRAM (TEE) (N/A) as a surgical intervention .  The patient's history has been reviewed, patient examined, no change in status, stable for surgery.  I have reviewed the patient's chart and labs.  Questions were answered to the patient's satisfaction.     Olga MillersBrian Crenshaw

## 2014-08-14 NOTE — Progress Notes (Signed)
  Echocardiogram Echocardiogram Transesophageal has been performed.  Margreta JourneyLOMBARDO, Daneshia Tavano 08/14/2014, 1:56 PM

## 2014-08-15 ENCOUNTER — Encounter (HOSPITAL_COMMUNITY): Payer: Self-pay | Admitting: Cardiology

## 2014-08-15 MED ORDER — POLYVINYL ALCOHOL 1.4 % OP SOLN: 1.0000 [drp] | Freq: Three times a day (TID) | OPHTHALMIC | Status: AC | PRN

## 2014-08-15 MED ORDER — POLYVINYL ALCOHOL 1.4 % OP SOLN
1.0000 [drp] | Freq: Three times a day (TID) | OPHTHALMIC | Status: DC | PRN
  Administered 2014-08-15: 1 [drp] via OPHTHALMIC
  Filled 2014-08-15: qty 15

## 2014-08-15 NOTE — Progress Notes (Signed)
Pt discharged per MD order and protocol. Discharge instructions reviewed with patient and all questions answered. Pt and son Loraine LericheMark given all prescriptions and aware of follow up appointments.

## 2014-08-15 NOTE — Discharge Summary (Signed)
[facility]  Physician Discharge Summary   Patient ID: Katherine Chapman MRN: [id number] DOB/AGE: 06/24/1930 81 y.o.  Admit date: 08/11/2014 Discharge date: 08/15/2014  PCP: No primary care provider on file.  DISCHARGE DIAGNOSES:  Principal Problem:   Syncope, brief Active Problems:   History of loop recorder   Essential hypertension   Chronic kidney disease, stage III (moderate)   Acute renal failure   Dementia   History of stroke   Hypokalemia   RECOMMENDATIONS FOR OUTPATIENT FOLLOW UP: 1. Needs to establish with a primary care physician.  2. Home health will be resumed.  DISCHARGE CONDITION: fair  Diet recommendation: Heart healthy  Filed Weights   08/11/14 1146 08/11/14 1956  Weight: 67.132 kg (148 lb) 69.2 kg (152 lb 8.9 oz)    INITIAL HISTORY: 81 year old African-American female with a history of dementia, stroke, CHF, hypertension, COPD with a loop recorder in place. Recently moved from [place]. She presented after a syncopal episode.  Consultants: Cardiology  Procedures:  TEE 3/14 Study Conclusions - Left ventricle: Wall thickness was increased in a pattern ofsevere LVH. Systolic function was normal. The estimated ejectionfraction was in the range of 55% to 60%. Wall motion was normal;there were no regional wall motion abnormalities. - Aortic valve: No evidence of vegetation. There was mildregurgitation. - Mitral valve: No evidence of vegetation. - Left atrium: No evidence of thrombus in the atrial cavity orappendage. - Right atrium: No evidence of thrombus in the atrial cavity orappendage. - Atrial septum: No defect or patent foramen ovale was identified. - Tricuspid valve: No evidence of vegetation. - Pulmonic valve: No evidence of vegetation. Impressions: - No RA mass.   HOSPITAL COURSE:   Syncope Patient was admitted due to syncope. She has a loop recorder in place, which was interrogated by cardiology. No arrhythmias were noted.  Orthostatics were not abnormal. It was felt, considering her electrolyte abnormalities, that she may have been dehydrated. Her Lasix was held along with her ACE inhibitor. She was gently hydrated. Potassium was corrected. Her creatinine which was elevated also responded to these interventions. She didn't have any further episodes in the hospital.  Abnormal echocardiogram She had an echocardiogram last month when she was hospitalized for fluid overload. Report is as below:  ECHO Feb 2016 Study Conclusions - Left ventricle: The cavity size was normal. There was mildconcentric hypertrophy. Systolic function was normal. Theestimated ejection fraction was in the range of 60% to 65%. Wallmotion was normal; there were no regional wall motionabnormalities. There was an increased relative contribution ofatrial contraction to ventricular filling. Doppler parameters areconsistent with abnormal left ventricular relaxation (grade 1diastolic dysfunction). - Aortic valve: Trileaflet; mildly calcified leaflets. Mildthickening, consistent with sclerosis. There was mildregurgitation. - Mitral valve: There was mild regurgitation. - Right atrium: There is a large mobile density that appears toextend from the wall of the RA just distal to the TV into the RAcavity of unknown etiology. The patient has a history ofreportedly having an ICD but no pacer wire noted in the RV onecho. Recommend Cardiac CT for further evaluation. - Pulmonary arteries: PA peak pressure: 40 mm Hg (S).  Unclear what this mobile density could be or whether it could've contributing to her syncope. Due to her chronic kidney disease she cannot have a cardiac CT. Discussed with cardiology. TEE would be the next optimal study. TEE was done yesterday. No mass was noted. No further investigations are necessary.  Acute on chronic kidney disease stage III Renal function has improved with gentle hydration.  Continue to monitor urine output. Will  discontinue the lisinopril. Resume Lasix after a few days. She has been told that she needs to follow-up with a primary care physician. Patient's son has been given contact numbers to seek out PCPs.  Hypokalemia Now in normal range.  Essential hypertension Blood pressure is better controlled. Dose of nitrate was increased. Hydralazine was added. Her ACE inhibitor will be discontinued due to renal failure.  History of dementia Currently stable. No behavioral issues.  History of stroke previously Stable. Not an active issue  History of COPD Stable. Not an active issue.  Moderate protein calorie malnutrition Encourage oral intake.  Overall remained stable. She did have some yellowish drainage from the right eye. No evidence for conjunctivitis. She'll be prescribed artificial tears. If her eyes get worse, she should seek attention for the same.  Okay for discharge today. Discussed with son.  PERTINENT LABS:  The results of significant diagnostics from this hospitalization (including imaging, microbiology, ancillary and laboratory) are listed below for reference.     Labs: Basic Metabolic Panel:  Recent Labs Lab 08/11/14 1410 08/12/14 0405 08/13/14 0515 08/14/14 0530  NA 140 141 137 139  K 2.6* 4.4 4.0 4.1  CL 104 106 105 108  CO2 GLUCOSE 111* 77 96 88  BUN 33* 30* 29* 29*  CREATININE 2.02* 1.68* 1.75* 1.75*  CALCIUM 8.6 8.7 8.7 8.5  MG 1.7  --   --   --   PHOS 3.3  --   --   --    Liver Function Tests:  Recent Labs Lab 08/11/14 1410 08/12/14 0405  AST 35 36  ALT 12 10  ALKPHOS 52 53  BILITOT 0.5 0.5  PROT 6.1 6.3  ALBUMIN 2.8* 2.9*   CBC:  Recent Labs Lab 08/11/14 1410 08/12/14 0405 08/13/14 0515  WBC 6.0 6.2 5.8  HGB 9.0* 10.1* 9.3*  HCT 28.3* 32.0* 29.6*  MCV 73.1* 74.8* 74.2*  PLT 193 173 153   IMAGING STUDIES Dg Chest 2 View  08/11/2014   CLINICAL DATA:  Loss of consciousness  EXAM: CHEST  2 VIEW  COMPARISON:  None  FINDINGS:  The heart size and mediastinal contours are within normal limits. Both lungs are clear. The visualized skeletal structures are unremarkable.  IMPRESSION: No active cardiopulmonary disease.   Electronically Signed   By: Signa Kell M.D.   On: 08/11/2014 12:58   Dg Knee 1-2 Views Left  07/21/2014   CLINICAL DATA:  Nontraumatic left knee pain for 2 weeks  EXAM: LEFT KNEE - 1-2 VIEW  COMPARISON:  None.  FINDINGS: There is no evidence of fracture, dislocation, or joint effusion. There is no evidence of arthropathy or other focal bone abnormality. Soft tissues are unremarkable.  IMPRESSION: Negative.   Electronically Signed   By: Ellery Plunk M.D.   On: 07/21/2014 23:05   US Renal  08/11/2014   CLINICAL DATA:  Acute onset of renal failure.  Initial encounter.  EXAM: RENAL/URINARY TRACT ULTRASOUND COMPLETE  COMPARISON:  None.  FINDINGS: Right Kidney:  Length: 8.6 cm. Diffusely increased parenchymal echogenicity noted. Two right renal cysts are seen, measuring 2.6 cm and 1.6 cm in size. No hydronephrosis visualized.  Left Kidney:  Length: 8.0 cm. Diffusely increased parenchymal echogenicity noted. A left renal cyst is noted, measuring 1.5 cm in size. No hydronephrosis visualized.  Bladder:  Appears normal for degree of bladder distention.  IMPRESSION: 1. Diffusely increased renal parenchymal echogenicity raises concern for medical  renal disease. 2. No evidence of hydronephrosis. 3. Bilateral renal cysts noted.   Electronically Signed   By: Roanna RaiderJeffery  Chang M.D.   On: 08/11/2014 19:02   Dg Hand Complete Left  07/21/2014   CLINICAL DATA:  Nontraumatic left hand pain for 2 weeks  EXAM: LEFT HAND - COMPLETE 3+ VIEW  COMPARISON:  None.  FINDINGS: There is no evidence of fracture or dislocation. There is no focal bone abnormality. Moderate arthritic changes are present at the carpometacarpal articulations. There is a dorsal carpal boss. Soft tissues are unremarkable.  IMPRESSION: Moderate arthritic changes.  Negative for fracture or other acute abnormality.   Electronically Signed   By: Ellery Plunkaniel R Mitchell M.D.   On: 07/21/2014 23:17    DISCHARGE EXAMINATION: Filed Vitals:   08/14/14 2017 08/15/14 0442 08/15/14 0745 08/15/14 1353  BP: 149/72 162/89  134/77  Pulse: 81 71  75  Temp: 97.9 F (36.6 C) 97.8 F (36.6 C)  98 F (36.7 C)  TempSrc: Oral Oral  Oral  Resp: 17 18  18   Height:      Weight:      SpO2: 100% 98% 97% 97%   General appearance: alert, cooperative, appears stated age and no distress Eyes: No redness noted. No discharge noted. Resp: clear to auscultation bilaterally Cardio: regular rate and rhythm, S1, S2 normal, no murmur, click, rub or gallop GI: soft, non-tender; bowel sounds normal; no masses,  no organomegaly Extremities: extremities normal, atraumatic, no cyanosis or edema  DISPOSITION: Home with son  Discharge Instructions    Call MD for:  difficulty breathing, headache or visual disturbances    Complete by:  As directed      Call MD for:  extreme fatigue    Complete by:  As directed      Call MD for:  persistant dizziness or light-headedness    Complete by:  As directed      Call MD for:  persistant nausea and vomiting    Complete by:  As directed      Call MD for:  severe uncontrolled pain    Complete by:  As directed      Call MD for:  temperature >100.4    Complete by:  As directed      Diet - low sodium heart healthy    Complete by:  As directed      Discharge instructions    Complete by:  As directed   Please get yourself a PCP as soon as possible for close follow up. You will need blood work to check your kidneys and potassium levels within next 7-10 days. Check your blood pressures periodically.     Increase activity slowly    Complete by:  As directed            ALLERGIES:  Allergies  Allergen Reactions  . Penicillins Hives     Current Discharge Medication List    START taking these medications   Details  hydrALAZINE (APRESOLINE) 50  MG tablet Take 1 tablet (50 mg total) by mouth 3 (three) times daily. Qty: 90 tablet, Refills: 0    polyvinyl alcohol (LIQUIFILM TEARS) 1.4 % ophthalmic solution Place 1 drop into the right eye 3 (three) times daily as needed for dry eyes. Qty: 15 mL, Refills: 0      CONTINUE these medications which have CHANGED   Details  furosemide (LASIX) 20 MG tablet Take 1 tablet (20 mg total) by mouth daily. Resume only after 4 days. Qty: 30  tablet, Refills: 0    isosorbide mononitrate (IMDUR) 60 MG 24 hr tablet Take 1 tablet (60 mg total) by mouth daily. Qty: 30 tablet, Refills: 0      CONTINUE these medications which have NOT CHANGED   Details  allopurinol (ZYLOPRIM) 100 MG tablet Take 1 tablet (100 mg total) by mouth daily. Qty: 60 tablet, Refills: 0    aspirin EC 81 MG tablet Take 81 mg by mouth daily.    budesonide-formoterol (SYMBICORT) 160-4.5 MCG/ACT inhaler Inhale 2 puffs into the lungs 2 (two) times daily. Qty: 1 Inhaler, Refills: 12    carvedilol (COREG) 25 MG tablet Take 1 tablet (25 mg total) by mouth 2 (two) times daily with a meal. Qty: 60 tablet, Refills: 3      STOP taking these medications     lisinopril (ZESTRIL) 10 MG tablet        Follow-up Information    Follow up with [facility].   Why:  call this number to get a primary care physician   Contact information:   [phone]      Follow up with [facility].   Why:  home health physical therapy and occupational therapy   Contact information:   [address] [place] [address] [phone]       TOTAL DISCHARGE TIME: 35 minutes  [facility]  [facility] Pager [phone]  08/15/2014, 3:24 PM

## 2014-08-15 NOTE — Progress Notes (Signed)
Spoke with pt's son, Loraine LericheMark, about pt being discharged. Loraine LericheMark states he will be here around 1 pm to pick his mom up. Will continue to monitor. Pt updated.

## 2014-08-15 NOTE — Progress Notes (Signed)
UR completed 

## 2014-08-15 NOTE — Progress Notes (Signed)
Physical Therapy Treatment Patient Details Name: Katherine Chapman MRN: [id number] DOB: 10/15/1930 Today's Date: 08/15/2014    History of Present Illness 81 y.o. female admitted with Generalized swelling, CKD, and elevated troponin.    PT Comments    Pt progressing with mobility and able to ambulate in the hall with RW and minguard assist. Pt incontinent of urine on arrival with all linens soaked with [facility] and linen change. Pt nearing baseline but no family present at this time. Will follow acutely.  Follow Up Recommendations  Home health PT;Supervision/Assistance - 24 hour     Equipment Recommendations       Recommendations for Other Services       Precautions / Restrictions Precautions Precautions: Fall Precaution Comments: incontinent    Mobility  Bed Mobility Overal bed mobility: Needs Assistance Bed Mobility: Supine to Sit     Supine to sit: Min assist     General bed mobility comments: cues and min assist to fully elevate trunk from surface  Transfers Overall transfer level: Needs assistance   Transfers: Sit to/from Stand Sit to Stand: Min guard Stand pivot transfers: Min assist       General transfer comment: cuse for hand placement, sequence and safety with assist at pelvis and hand to [facility]  Ambulation/Gait Ambulation/Gait assistance: Min guard Ambulation Distance (Feet): 120 Feet Assistive device: Rolling walker (2 wheeled) Gait Pattern/deviations: Step-through pattern;Decreased stride length;Trunk flexed   Gait velocity interpretation: Below normal speed for age/gender General Gait Details: cues for posture, position in RW, avoidance of obstacles and min assist to steer   Stairs            Wheelchair Mobility    Modified Rankin (Stroke Patients Only)       Balance Overall balance assessment: Needs assistance   Sitting balance-Leahy Scale: Good       Standing balance-Leahy Scale: Fair                       Cognition Arousal/Alertness: Awake/alert Behavior During Therapy: WFL for tasks assessed/performed Overall Cognitive Status: History of cognitive impairments - at baseline                      Exercises      General Comments        Pertinent Vitals/Pain Pain Assessment: No/denies pain    Home Living                      Prior Function            PT Goals (current goals can now be found in the care plan section) Progress towards PT goals: Progressing toward goals    Frequency       PT Plan Current plan remains appropriate    Co-evaluation             End of Session   Activity Tolerance: Patient tolerated treatment well Patient left: in chair;with call bell/phone within reach;with chair alarm set     Time: [id number] PT Time Calculation (min) (ACUTE ONLY): 26 min  Charges:  $Gait Training: 8-22 mins $Therapeutic Activity: 8-22 mins                    G Codes:      Delorse Lekabor, Lysa Livengood Beth 08/15/2014, 12:42 PM Delaney MeigsMaija Tabor Cody Albus, PT [phone]

## 2014-08-18 ENCOUNTER — Telehealth: Payer: Self-pay | Admitting: Emergency Medicine

## 2014-08-18 NOTE — Telephone Encounter (Signed)
Needs sign and fax. From Jeniifer Haltom [phone]

## 2014-08-24 NOTE — ED Provider Notes (Signed)
CSN: [id number]     Arrival date & time 08/11/14  1142 History   First MD Initiated Contact with Patient 08/11/14 1155     Chief Complaint  Patient presents with  . Loss of Consciousness     (Consider location/radiation/quality/duration/timing/severity/associated sxs/prior Treatment) HPI Comments:  presents with LOC. Pt states she was standing at her walker, waiting for her PT to come when she felt a "kick" in her chest and dropped to the ground, though was caught my grandson. [place], [place] not have PCP or cardiologist locally. She feels like her defibrillator went off. She current has no complaints. She denies recent illness or injury.   Patient is a 81 y.o. female presenting with syncope.  Loss of Consciousness Associated symptoms: no chest pain, no confusion, no diaphoresis, no fever, no headaches, no nausea, no palpitations, no shortness of breath, no vomiting and no weakness     Past Medical History  Diagnosis Date  . CHF (congestive heart failure)   . Hypertension   . History of loop recorder    Past Surgical History  Procedure Laterality Date  . Ep implantable device      loop recorder  . Tee without cardioversion N/A 08/14/2014    Procedure: TRANSESOPHAGEAL ECHOCARDIOGRAM (TEE);  Surgeon: Lewayne Bunting, MD;  Location: [facility] ENDOSCOPY;  Service: Cardiovascular;  Laterality: N/A;   History reviewed. No pertinent family history. History  Substance Use Topics  . Smoking status: Current Some Day Smoker  . Smokeless tobacco: Not on file  . Alcohol Use: No   OB History    No data available     Review of Systems  Constitutional: Negative for fever, chills, diaphoresis, activity change, appetite change and fatigue.  HENT: Negative for congestion, facial swelling, rhinorrhea and sore throat.   Eyes: Negative for photophobia and discharge.  Respiratory: Negative for cough, chest tightness and shortness of breath.   Cardiovascular: Positive for  syncope. Negative for chest pain, palpitations and leg swelling.  Gastrointestinal: Negative for nausea, vomiting, abdominal pain and diarrhea.  Endocrine: Negative for polydipsia and polyuria.  Genitourinary: Negative for dysuria, frequency, difficulty urinating and pelvic pain.  Musculoskeletal: Negative for back pain, arthralgias, neck pain and neck stiffness.  Skin: Negative for color change and wound.  Allergic/Immunologic: Negative for immunocompromised state.  Neurological: Positive for syncope. Negative for facial asymmetry, weakness, numbness and headaches.  Hematological: Does not bruise/bleed easily.  Psychiatric/Behavioral: Negative for confusion and agitation.      Allergies  Penicillins  Home Medications   Prior to Admission medications   Medication Sig Start Date End Date Taking? Authorizing Provider  allopurinol (ZYLOPRIM) 100 MG tablet Take 1 tablet (100 mg total) by mouth daily. 07/23/14  Yes Richarda Overlie, MD  aspirin EC 81 MG tablet Take 81 mg by mouth daily.   Yes Historical Provider, MD  budesonide-formoterol (SYMBICORT) 160-4.5 MCG/ACT inhaler Inhale 2 puffs into the lungs 2 (two) times daily. Patient taking differently: Inhale 2 puffs into the lungs 2 (two) times daily as needed (shortness of breath).  07/23/14  Yes Richarda Overlie, MD  carvedilol (COREG) 25 MG tablet Take 1 tablet (25 mg total) by mouth 2 (two) times daily with a meal. 07/23/14  Yes Richarda Overlie, MD  furosemide (LASIX) 20 MG tablet Take 1 tablet (20 mg total) by mouth daily. Resume only after 4 days. 08/12/14   Osvaldo Shipper, MD  hydrALAZINE (APRESOLINE) 50 MG tablet Take 1 tablet (50 mg total) by mouth  3 (three) times daily. 08/12/14   Osvaldo Shipper, MD  isosorbide mononitrate (IMDUR) 60 MG 24 hr tablet Take 1 tablet (60 mg total) by mouth daily. 08/12/14   Osvaldo Shipper, MD  polyvinyl alcohol (LIQUIFILM TEARS) 1.4 % ophthalmic solution Place 1 drop into the right eye 3 (three) times daily as needed  for dry eyes. 08/15/14   Osvaldo Shipper, MD   BP 134/77 mmHg  Pulse 75  Temp(Src) 98 F (36.7 C) (Oral)  Resp 18  Ht  (1.702 m)  Wt 152 lb 8.9 oz (69.2 kg)  BMI 23.89 kg/m2  SpO2 97% Physical Exam  Constitutional: She is oriented to person, place, and time. She appears well-developed and well-nourished. No distress.  HENT:  Head: Normocephalic and atraumatic.  Mouth/Throat: No oropharyngeal exudate.  Eyes: Pupils are equal, round, and reactive to light.  Neck: Normal range of motion. Neck supple.  Cardiovascular: Normal rate, regular rhythm and normal heart sounds.  Exam reveals no gallop and no friction rub.   No murmur heard. Pulmonary/Chest: Effort normal and breath sounds normal. No respiratory distress. She has no wheezes. She has no rales.  Abdominal: Soft. Bowel sounds are normal. She exhibits no distension and no mass. There is no tenderness. There is no rebound and no guarding.  Musculoskeletal: Normal range of motion. She exhibits no edema or tenderness.  Neurological: She is alert and oriented to person, place, and time. She has normal strength. She displays no tremor. No cranial nerve deficit or sensory deficit. She exhibits normal muscle tone. She displays a negative Romberg sign. GCS eye subscore is 4. GCS verbal subscore is 5. GCS motor subscore is 6.  Skin: Skin is warm and dry.  Psychiatric: She has a normal mood and affect.    ED Course  Procedures (including critical care time) Labs Review Labs Reviewed  CBC - Abnormal; Notable for the following:    Hemoglobin 9.0 (*)    HCT 28.3 (*)    MCV 73.1 (*)    MCH 23.3 (*)    RDW 15.6 (*)    All other components within normal limits  COMPREHENSIVE METABOLIC PANEL - Abnormal; Notable for the following:    Potassium 2.6 (*)    Glucose, Bld 111 (*)    BUN 33 (*)    Creatinine, Ser 2.02 (*)    Albumin 2.8 (*)    GFR calc non Af Amer 22 (*)    GFR calc Af Amer 25 (*)    All other components within normal  limits  URINALYSIS, ROUTINE W REFLEX MICROSCOPIC - Abnormal; Notable for the following:    Leukocytes, UA TRACE (*)    All other components within normal limits  COMPREHENSIVE METABOLIC PANEL - Abnormal; Notable for the following:    BUN 30 (*)    Creatinine, Ser 1.68 (*)    Albumin 2.9 (*)    GFR calc non Af Amer 27 (*)    GFR calc Af Amer 32 (*)    All other components within normal limits  CBC - Abnormal; Notable for the following:    Hemoglobin 10.1 (*)    HCT 32.0 (*)    MCV 74.8 (*)    MCH 23.6 (*)    RDW 16.0 (*)    All other components within normal limits  URINE MICROSCOPIC-ADD ON - Abnormal; Notable for the following:    Squamous Epithelial / LPF FEW (*)    All other components within normal limits  CBC - Abnormal; Notable  for the following:    Hemoglobin 9.3 (*)    HCT 29.6 (*)    MCV 74.2 (*)    MCH 23.3 (*)    RDW 15.8 (*)    All other components within normal limits  BASIC METABOLIC PANEL - Abnormal; Notable for the following:    BUN 29 (*)    Creatinine, Ser 1.75 (*)    GFR calc non Af Amer 26 (*)    GFR calc Af Amer 30 (*)    All other components within normal limits  BASIC METABOLIC PANEL - Abnormal; Notable for the following:    BUN 29 (*)    Creatinine, Ser 1.75 (*)    GFR calc non Af Amer 26 (*)    GFR calc Af Amer 30 (*)    All other components within normal limits  MAGNESIUM  PHOSPHORUS  I-STAT TROPOININ, ED  I-STAT TROPOININ, ED    Imaging Review No results found.   EKG Interpretation   Date/Time:  Friday August 11 2014 11:43:53 EST Ventricular Rate:  59 PR Interval:  138 QRS Duration: 84 QT Interval:  429 QTC Calculation: 425 R Axis:   14 Text Interpretation:  Sinus rhythm Multiple premature complexes, vent   Abnormal R-wave progression, early transition Nonspecific T abnrm,  anterolateral leads Confirmed by DOCHERTY  MD, MEGAN ([id number]) on 08/11/2014  4:45:57 PM      MDM   Final diagnoses:  ARF (acute renal failure)  History of  stroke  Syncope, unspecified syncope type   Pt is a 81 y.o. female with Pmhx as above who presents with LOC. Pt states she was standing at her walker, waiting for her PT to come when she felt a "kick" in her chest and dropped to the ground, though was caught my grandson. [place], [place] not have PCP or cardiologist locally. She feels like her defibrillator went off. She current has no complaints. Will have device interrogated and speak with cardiology.  Device interrogated and is actually event monitor. No arrhythmias seen. Cardiology has seen pt, would like for her to be admitted to medicine. Trop negative. NO acute CXR findings. Cr 1.75 w/o comparison.    Toy CookeyMegan Docherty, MD 08/24/14 1314

## 2015-04-01 ENCOUNTER — Emergency Department (HOSPITAL_COMMUNITY)
Admission: EM | Admit: 2015-04-01 | Discharge: 2015-04-02 | Disposition: A | Discharge status: Home / Self Care | Payer: Medicare Other | Attending: Emergency Medicine | Admitting: Emergency Medicine

## 2015-04-01 ENCOUNTER — Emergency Department (HOSPITAL_COMMUNITY): Payer: Medicare Other

## 2015-04-01 ENCOUNTER — Encounter (HOSPITAL_COMMUNITY): Payer: Self-pay | Admitting: Emergency Medicine

## 2015-04-01 DIAGNOSIS — Z880 Allergy status to penicillin: Secondary | ICD-10-CM | POA: Diagnosis not present

## 2015-04-01 DIAGNOSIS — R55 Syncope and collapse: Secondary | ICD-10-CM | POA: Diagnosis not present

## 2015-04-01 DIAGNOSIS — R52 Pain, unspecified: Secondary | ICD-10-CM

## 2015-04-01 DIAGNOSIS — E860 Dehydration: Secondary | ICD-10-CM | POA: Diagnosis not present

## 2015-04-01 DIAGNOSIS — I10 Essential (primary) hypertension: Secondary | ICD-10-CM | POA: Insufficient documentation

## 2015-04-01 DIAGNOSIS — Z8673 Personal history of transient ischemic attack (TIA), and cerebral infarction without residual deficits: Secondary | ICD-10-CM | POA: Insufficient documentation

## 2015-04-01 DIAGNOSIS — I509 Heart failure, unspecified: Secondary | ICD-10-CM | POA: Insufficient documentation

## 2015-04-01 DIAGNOSIS — Z720 Tobacco use: Secondary | ICD-10-CM | POA: Diagnosis not present

## 2015-04-01 DIAGNOSIS — Z7982 Long term (current) use of aspirin: Secondary | ICD-10-CM | POA: Insufficient documentation

## 2015-04-01 DIAGNOSIS — Z79899 Other long term (current) drug therapy: Secondary | ICD-10-CM | POA: Insufficient documentation

## 2015-04-01 HISTORY — DX: Cerebral infarction, unspecified: I63.9

## 2015-04-01 LAB — URINALYSIS, ROUTINE W REFLEX MICROSCOPIC
BILIRUBIN URINE: NEGATIVE
GLUCOSE, UA: NEGATIVE mg/dL
Hgb urine dipstick: NEGATIVE
KETONES UR: NEGATIVE mg/dL
Leukocytes, UA: NEGATIVE
Nitrite: NEGATIVE
Protein, ur: NEGATIVE mg/dL
Specific Gravity, Urine: 1.007 (ref 1.005–1.030)
Urobilinogen, UA: 0.2 mg/dL (ref 0.0–1.0)
pH: 7 (ref 5.0–8.0)

## 2015-04-01 LAB — CBC WITH DIFFERENTIAL/PLATELET
Basophils Absolute: 0 10*3/uL (ref 0.0–0.1)
Basophils Relative: 0 %
EOS ABS: 0 10*3/uL (ref 0.0–0.7)
EOS PCT: 1 %
HCT: 35.9 % — ABNORMAL LOW (ref 36.0–46.0)
Hemoglobin: 11.3 g/dL — ABNORMAL LOW (ref 12.0–15.0)
Lymphocytes Relative: 31 %
Lymphs Abs: 1.7 10*3/uL (ref 0.7–4.0)
MCH: 23.8 pg — AB (ref 26.0–34.0)
MCHC: 31.5 g/dL (ref 30.0–36.0)
MCV: 75.6 fL — ABNORMAL LOW (ref 78.0–100.0)
Monocytes Absolute: 0.5 10*3/uL (ref 0.1–1.0)
Monocytes Relative: 10 %
Neutro Abs: 3.2 10*3/uL (ref 1.7–7.7)
Neutrophils Relative %: 58 %
PLATELETS: 241 10*3/uL (ref 150–400)
RBC: 4.75 MIL/uL (ref 3.87–5.11)
RDW: 14 % (ref 11.5–15.5)
WBC: 5.5 10*3/uL (ref 4.0–10.5)

## 2015-04-01 LAB — COMPREHENSIVE METABOLIC PANEL
ALBUMIN: 3.2 g/dL — AB (ref 3.5–5.0)
ALT: 20 U/L (ref 14–54)
AST: 57 U/L — ABNORMAL HIGH (ref 15–41)
Alkaline Phosphatase: 66 U/L (ref 38–126)
Anion gap: 10 (ref 5–15)
BILIRUBIN TOTAL: 0.6 mg/dL (ref 0.3–1.2)
BUN: 20 mg/dL (ref 6–20)
CHLORIDE: 97 mmol/L — AB (ref 101–111)
CO2: 27 mmol/L (ref 22–32)
CREATININE: 1.51 mg/dL — AB (ref 0.44–1.00)
Calcium: 8.8 mg/dL — ABNORMAL LOW (ref 8.9–10.3)
GFR calc Af Amer: 36 mL/min — ABNORMAL LOW (ref >60)
GFR, EST NON AFRICAN AMERICAN: 31 mL/min — AB (ref >60)
GLUCOSE: 120 mg/dL — AB (ref 65–99)
Potassium: 3.3 mmol/L — ABNORMAL LOW (ref 3.5–5.1)
Sodium: 134 mmol/L — ABNORMAL LOW (ref 135–145)
Total Protein: 6.8 g/dL (ref 6.5–8.1)

## 2015-04-01 NOTE — ED Notes (Signed)
Spoke with patient's son to find out if he is planning to come up here. He states he is waiting for owner of the house to come lock the door so he can leave. States he does not have a key to the home.

## 2015-04-01 NOTE — ED Notes (Signed)
[facility] contacted to transport patient to home address 

## 2015-04-01 NOTE — ED Notes (Signed)
Son called EMS for syncopal episode lasting about 2-3 minutes. No injury or fall; was sitting on bed at the time. On EMS arrival, eyes open, nonverbal. Within 5 minutes, she was verbal but sluggish answering questions. By the time of departure, she was back to baseline per the son. Patient states she felt dizzy before she passed out.

## 2015-04-01 NOTE — Discharge Instructions (Signed)
As discussed, today's evaluation has been largely reassuring, but it is important that you follow-up with your primary care physician, and continue to take all medication as directed.  Please be sure to return here for any concerning changes in your condition.    Otherwise, drink plenty of fluids, get plenty of rest.

## 2015-04-01 NOTE — ED Notes (Signed)
Pt off unit for imaging

## 2015-04-01 NOTE — ED Provider Notes (Signed)
CSN: [id number]     Arrival date & time 04/01/15  1846 History   First MD Initiated Contact with Patient 04/01/15 1848     Chief Complaint  Patient presents with  . Loss of Consciousness     (Consider location/radiation/quality/duration/timing/severity/associated sxs/prior Treatment) HPI Patient presents after an episode of syncope. She has recall of the event, states that she felt lightheaded, before falling backwards. She acknowledges a history of somewhat frequent falls, and syncope. She does have history of CHF, hypertension, states that she takes all medication as directed, but continues to have persistent hypertension. Currently she opens pain in her right shoulder, it is unclear if the pain is new or recent onset. She denies weakness in any extremity, current confusion, fever, nausea, incontinence. No recent medication changes, diet changes.  Past Medical History  Diagnosis Date  . CHF (congestive heart failure) (HCC)   . Hypertension   . History of loop recorder   . Stroke [facility])    Past Surgical History  Procedure Laterality Date  . Ep implantable device      loop recorder  . Tee without cardioversion N/A 08/14/2014    Procedure: TRANSESOPHAGEAL ECHOCARDIOGRAM (TEE);  Surgeon: Lewayne Bunting, MD;  Location: [facility] ENDOSCOPY;  Service: Cardiovascular;  Laterality: N/A;   History reviewed. No pertinent family history. Social History  Substance Use Topics  . Smoking status: Current Some Day Smoker  . Smokeless tobacco: None  . Alcohol Use: No   OB History    No data available     Review of Systems  Constitutional:       Per HPI, otherwise negative  HENT:       Per HPI, otherwise negative  Respiratory:       Per HPI, otherwise negative  Cardiovascular:       Per HPI, otherwise negative  Gastrointestinal: Negative for vomiting.  Endocrine:       Negative aside from HPI  Genitourinary:       Neg aside from HPI   Musculoskeletal:       Per HPI, otherwise  negative  Skin: Negative.   Neurological: Positive for syncope and light-headedness.      Allergies  Penicillins  Home Medications   Prior to Admission medications   Medication Sig Start Date End Date Taking? Authorizing Provider  allopurinol (ZYLOPRIM) 100 MG tablet Take 1 tablet (100 mg total) by mouth daily. 07/23/14   Richarda Overlie, MD  aspirin EC 81 MG tablet Take 81 mg by mouth daily.    Historical Provider, MD  budesonide-formoterol (SYMBICORT) 160-4.5 MCG/ACT inhaler Inhale 2 puffs into the lungs 2 (two) times daily. Patient taking differently: Inhale 2 puffs into the lungs 2 (two) times daily as needed (shortness of breath).  07/23/14   Richarda Overlie, MD  carvedilol (COREG) 25 MG tablet Take 1 tablet (25 mg total) by mouth 2 (two) times daily with a meal. 07/23/14   Richarda Overlie, MD  furosemide (LASIX) 20 MG tablet Take 1 tablet (20 mg total) by mouth daily. Resume only after 4 days. 08/12/14   Osvaldo Shipper, MD  hydrALAZINE (APRESOLINE) 50 MG tablet Take 1 tablet (50 mg total) by mouth 3 (three) times daily. 08/12/14   Osvaldo Shipper, MD  isosorbide mononitrate (IMDUR) 60 MG 24 hr tablet Take 1 tablet (60 mg total) by mouth daily. 08/12/14   Osvaldo Shipper, MD  polyvinyl alcohol (LIQUIFILM TEARS) 1.4 % ophthalmic solution Place 1 drop into the right eye 3 (three) times daily as needed  for dry eyes. 08/15/14   Osvaldo ShipperGokul Krishnan, MD   BP 193/91 mmHg  Pulse 77  Resp 13  Ht 5\' 7"  (1.702 m)  Wt 140 lb (63.504 kg)  BMI 21.92 kg/m2  SpO2 100% Physical Exam  Constitutional: She is oriented to person, place, and time. She has a sickly appearance. No distress.  HENT:  Head: Normocephalic and atraumatic.  Eyes: Conjunctivae and EOM are normal.  Cardiovascular: Normal rate and regular rhythm.   Pulmonary/Chest: Effort normal and breath sounds normal. No stridor. No respiratory distress.  Abdominal: She exhibits no distension.  Musculoskeletal: She exhibits no edema.       Arms: Pelvis is  stable, patient flexes each hip independently, with no loss of range of motion, both mild tenderness to the patient about the right lateral pelvis.  Neurological: She is alert and oriented to person, place, and time. No cranial nerve deficit.  Skin: Skin is warm and dry.  Psychiatric: She has a normal mood and affect.  Nursing note and vitals reviewed.   ED Course  Procedures (including critical care time) Labs Review Labs Reviewed  COMPREHENSIVE METABOLIC PANEL - Abnormal; Notable for the following:    Sodium 134 (*)    Potassium 3.3 (*)    Chloride 97 (*)    Glucose, Bld 120 (*)    Creatinine, Ser 1.51 (*)    Calcium 8.8 (*)    Albumin 3.2 (*)    AST 57 (*)    GFR calc non Af Amer 31 (*)    GFR calc Af Amer 36 (*)    All other components within normal limits  CBC WITH DIFFERENTIAL/PLATELET - Abnormal; Notable for the following:    Hemoglobin 11.3 (*)    HCT 35.9 (*)    MCV 75.6 (*)    MCH 23.8 (*)    All other components within normal limits  URINALYSIS, ROUTINE W REFLEX MICROSCOPIC (NOT AT [facility]) - Abnormal; Notable for the following:    APPearance CLOUDY (*)    All other components within normal limits    Imaging Review Dg Pelvis 1-2 Views  04/01/2015  CLINICAL DATA:  Status post syncope and fall, with concern for pelvic injury. Initial encounter. EXAM: PELVIS - 1-2 VIEW COMPARISON:  None. FINDINGS: There is no evidence of fracture or dislocation. Both femoral heads are seated normally within their respective acetabula. Mild degenerative change is noted along the lower lumbar spine. Mild sclerotic change is seen at the left sacroiliac joint. The visualized bowel gas pattern is grossly unremarkable in appearance. Scattered phleboliths are noted within the pelvis. IMPRESSION: No evidence of fracture or dislocation. Electronically Signed   By: Roanna RaiderJeffery  Chang M.D.   On: 04/01/2015 20:46   Dg Shoulder Right  04/01/2015  CLINICAL DATA:  Syncopal episode today with fall onto  right shoulder. Shoulder pain. Initial encounter. EXAM: RIGHT SHOULDER - 2+ VIEW COMPARISON:  Chest radiographs 08/11/2014. FINDINGS: There is stable posttraumatic deformity of the mid humeral diaphysis. There is mild irregularity of the greater tuberosity which could be due to spurring. A small avulsion/impaction fracture in this area is difficult to exclude on this two view examination. The articular surface of the humeral head appears intact. There are mild acromioclavicular degenerative changes with mild subacromial spurring. IMPRESSION: Possible small fracture of the greater tuberosity versus spurring. No evidence of dislocation. Electronically Signed   By: Carey BullocksWilliam  Veazey M.D.   On: 04/01/2015 20:47   I have personally reviewed and evaluated these images and lab results  as part of my medical decision-making.   EKG Interpretation   Date/Time:  Sunday April 01 2015 18:55:19 EDT Ventricular Rate:  75 PR Interval:  160 QRS Duration: 82 QT Interval:  428 QTC Calculation: 478 R Axis:   45 Text Interpretation:  Sinus rhythm Abnormal R-wave progression, early  transition Probable LVH with secondary repol abnrm Sinus rhythm Artifact  Left ventricular hypertrophy Abnormal ekg Confirmed by Gerhard Munch   MD [id number]) on 04/01/2015 7:14:36 PM     On repeat exam the patient is calm, requesting food. We discussed all findings, including some evidence for dehydration. Vital signs now unremarkable, with blood pressure 155/70.  MDM  Elderly female with history of syncope, falling presents after an episode typical to many prior. Here the patient is awake, alert, moving all x-Hollerbach spontaneously, with no evidence for ongoing neurologic deficit. Patient has pain in multiple areas, though no evidence for fracture. Labs suggest mild dehydration, no acute abnormalities. Patient improved here, was requesting food. Patient had no new complaints, and with reassuring findings, there is low suspicion for  cardiogenic syncope, no suspicion for occult infection. Patient discharged in stable condition.  Gerhard Munch, MD 04/02/15 [phone]

## 2015-04-02 NOTE — ED Notes (Signed)
Pt departed in care of [facility] and in NAD. [facility] called ahead to speak with the son, Loraine LericheMark, to verify that he would be present upon their arrival. Loraine LericheMark answered and said he was ready and waiting for transport's arrival.

## 2016-04-22 ENCOUNTER — Emergency Department (HOSPITAL_COMMUNITY): Payer: Medicare Other

## 2016-04-22 ENCOUNTER — Inpatient Hospital Stay (HOSPITAL_COMMUNITY)
Admission: EM | Admit: 2016-04-22 | Discharge: 2016-05-06 | DRG: 291 | Disposition: A | Discharge status: Skilled Nursing Facility | Payer: Medicare Other | Attending: Internal Medicine | Admitting: Internal Medicine

## 2016-04-22 ENCOUNTER — Encounter (HOSPITAL_COMMUNITY): Payer: Self-pay | Admitting: Emergency Medicine

## 2016-04-22 DIAGNOSIS — I130 Hypertensive heart and chronic kidney disease with heart failure and stage 1 through stage 4 chronic kidney disease, or unspecified chronic kidney disease: Principal | ICD-10-CM | POA: Diagnosis present

## 2016-04-22 DIAGNOSIS — L89112 Pressure ulcer of right upper back, stage 2: Secondary | ICD-10-CM | POA: Diagnosis present

## 2016-04-22 DIAGNOSIS — R0602 Shortness of breath: Secondary | ICD-10-CM | POA: Diagnosis not present

## 2016-04-22 DIAGNOSIS — D696 Thrombocytopenia, unspecified: Secondary | ICD-10-CM | POA: Diagnosis not present

## 2016-04-22 DIAGNOSIS — Z8673 Personal history of transient ischemic attack (TIA), and cerebral infarction without residual deficits: Secondary | ICD-10-CM

## 2016-04-22 DIAGNOSIS — Z7982 Long term (current) use of aspirin: Secondary | ICD-10-CM

## 2016-04-22 DIAGNOSIS — Z7951 Long term (current) use of inhaled steroids: Secondary | ICD-10-CM

## 2016-04-22 DIAGNOSIS — H543 Unqualified visual loss, both eyes: Secondary | ICD-10-CM | POA: Diagnosis present

## 2016-04-22 DIAGNOSIS — N390 Urinary tract infection, site not specified: Secondary | ICD-10-CM | POA: Diagnosis present

## 2016-04-22 DIAGNOSIS — I248 Other forms of acute ischemic heart disease: Secondary | ICD-10-CM | POA: Diagnosis present

## 2016-04-22 DIAGNOSIS — Z681 Body mass index (BMI) 19 or less, adult: Secondary | ICD-10-CM

## 2016-04-22 DIAGNOSIS — L89320 Pressure ulcer of left buttock, unstageable: Secondary | ICD-10-CM | POA: Diagnosis present

## 2016-04-22 DIAGNOSIS — R64 Cachexia: Secondary | ICD-10-CM | POA: Diagnosis present

## 2016-04-22 DIAGNOSIS — A4159 Other Gram-negative sepsis: Secondary | ICD-10-CM | POA: Diagnosis present

## 2016-04-22 DIAGNOSIS — Z880 Allergy status to penicillin: Secondary | ICD-10-CM

## 2016-04-22 DIAGNOSIS — L89020 Pressure ulcer of left elbow, unstageable: Secondary | ICD-10-CM | POA: Diagnosis present

## 2016-04-22 DIAGNOSIS — F0390 Unspecified dementia without behavioral disturbance: Secondary | ICD-10-CM | POA: Diagnosis present

## 2016-04-22 DIAGNOSIS — L89310 Pressure ulcer of right buttock, unstageable: Secondary | ICD-10-CM | POA: Diagnosis present

## 2016-04-22 DIAGNOSIS — Z7401 Bed confinement status: Secondary | ICD-10-CM

## 2016-04-22 DIAGNOSIS — Z66 Do not resuscitate: Secondary | ICD-10-CM | POA: Diagnosis not present

## 2016-04-22 DIAGNOSIS — I509 Heart failure, unspecified: Secondary | ICD-10-CM

## 2016-04-22 DIAGNOSIS — L89892 Pressure ulcer of other site, stage 2: Secondary | ICD-10-CM | POA: Diagnosis present

## 2016-04-22 DIAGNOSIS — F17200 Nicotine dependence, unspecified, uncomplicated: Secondary | ICD-10-CM | POA: Diagnosis present

## 2016-04-22 DIAGNOSIS — L89312 Pressure ulcer of right buttock, stage 2: Secondary | ICD-10-CM | POA: Diagnosis present

## 2016-04-22 DIAGNOSIS — Z515 Encounter for palliative care: Secondary | ICD-10-CM

## 2016-04-22 DIAGNOSIS — J9601 Acute respiratory failure with hypoxia: Secondary | ICD-10-CM | POA: Diagnosis present

## 2016-04-22 DIAGNOSIS — Z79899 Other long term (current) drug therapy: Secondary | ICD-10-CM

## 2016-04-22 DIAGNOSIS — L8990 Pressure ulcer of unspecified site, unspecified stage: Secondary | ICD-10-CM | POA: Diagnosis present

## 2016-04-22 DIAGNOSIS — M2450 Contracture, unspecified joint: Secondary | ICD-10-CM | POA: Diagnosis present

## 2016-04-22 DIAGNOSIS — R778 Other specified abnormalities of plasma proteins: Secondary | ICD-10-CM | POA: Diagnosis present

## 2016-04-22 DIAGNOSIS — R7881 Bacteremia: Secondary | ICD-10-CM

## 2016-04-22 DIAGNOSIS — E875 Hyperkalemia: Secondary | ICD-10-CM | POA: Diagnosis present

## 2016-04-22 DIAGNOSIS — D638 Anemia in other chronic diseases classified elsewhere: Secondary | ICD-10-CM | POA: Diagnosis present

## 2016-04-22 DIAGNOSIS — L89623 Pressure ulcer of left heel, stage 3: Secondary | ICD-10-CM | POA: Diagnosis present

## 2016-04-22 DIAGNOSIS — E872 Acidosis: Secondary | ICD-10-CM | POA: Diagnosis present

## 2016-04-22 DIAGNOSIS — E43 Unspecified severe protein-calorie malnutrition: Secondary | ICD-10-CM | POA: Diagnosis present

## 2016-04-22 DIAGNOSIS — I5021 Acute systolic (congestive) heart failure: Secondary | ICD-10-CM | POA: Diagnosis present

## 2016-04-22 DIAGNOSIS — N183 Chronic kidney disease, stage 3 unspecified: Secondary | ICD-10-CM | POA: Diagnosis present

## 2016-04-22 DIAGNOSIS — I272 Pulmonary hypertension, unspecified: Secondary | ICD-10-CM | POA: Diagnosis present

## 2016-04-22 DIAGNOSIS — E440 Moderate protein-calorie malnutrition: Secondary | ICD-10-CM | POA: Insufficient documentation

## 2016-04-22 DIAGNOSIS — E8809 Other disorders of plasma-protein metabolism, not elsewhere classified: Secondary | ICD-10-CM | POA: Diagnosis present

## 2016-04-22 DIAGNOSIS — B9620 Unspecified Escherichia coli [E. coli] as the cause of diseases classified elsewhere: Secondary | ICD-10-CM | POA: Diagnosis present

## 2016-04-22 DIAGNOSIS — I10 Essential (primary) hypertension: Secondary | ICD-10-CM | POA: Diagnosis present

## 2016-04-22 DIAGNOSIS — R7989 Other specified abnormal findings of blood chemistry: Secondary | ICD-10-CM | POA: Diagnosis present

## 2016-04-22 DIAGNOSIS — I313 Pericardial effusion (noninflammatory): Secondary | ICD-10-CM | POA: Diagnosis present

## 2016-04-22 DIAGNOSIS — L89322 Pressure ulcer of left buttock, stage 2: Secondary | ICD-10-CM | POA: Diagnosis present

## 2016-04-22 DIAGNOSIS — Z7189 Other specified counseling: Secondary | ICD-10-CM

## 2016-04-22 LAB — CBC WITH DIFFERENTIAL/PLATELET
BASOS ABS: 0 10*3/uL (ref 0.0–0.1)
BASOS PCT: 0 %
EOS ABS: 0 10*3/uL (ref 0.0–0.7)
Eosinophils Relative: 0 %
HCT: 29.6 % — ABNORMAL LOW (ref 36.0–46.0)
HEMOGLOBIN: 10 g/dL — AB (ref 12.0–15.0)
LYMPHS ABS: 0.7 10*3/uL (ref 0.7–4.0)
LYMPHS PCT: 8 %
MCH: 24.2 pg — AB (ref 26.0–34.0)
MCHC: 33.8 g/dL (ref 30.0–36.0)
MCV: 71.5 fL — ABNORMAL LOW (ref 78.0–100.0)
Monocytes Absolute: 0.2 10*3/uL (ref 0.1–1.0)
Monocytes Relative: 3 %
NEUTROS ABS: 7.3 10*3/uL (ref 1.7–7.7)
Neutrophils Relative %: 89 %
Platelets: 203 10*3/uL (ref 150–400)
RBC: 4.14 MIL/uL (ref 3.87–5.11)
RDW: 13.8 % (ref 11.5–15.5)
WBC: 8.2 10*3/uL (ref 4.0–10.5)

## 2016-04-22 LAB — I-STAT TROPONIN, ED: Troponin i, poc: 0.09 ng/mL (ref 0.00–0.08)

## 2016-04-22 LAB — BRAIN NATRIURETIC PEPTIDE: B NATRIURETIC PEPTIDE 5: 1908.7 pg/mL — AB (ref 0.0–100.0)

## 2016-04-22 LAB — COMPREHENSIVE METABOLIC PANEL
ALK PHOS: 87 U/L (ref 38–126)
ALT: 32 U/L (ref 14–54)
AST: 69 U/L — AB (ref 15–41)
Albumin: 1.4 g/dL — ABNORMAL LOW (ref 3.5–5.0)
Anion gap: 7 (ref 5–15)
BILIRUBIN TOTAL: 0.6 mg/dL (ref 0.3–1.2)
BUN: 36 mg/dL — AB (ref 6–20)
CALCIUM: 7.8 mg/dL — AB (ref 8.9–10.3)
CHLORIDE: 108 mmol/L (ref 101–111)
CO2: 18 mmol/L — ABNORMAL LOW (ref 22–32)
CREATININE: 1.5 mg/dL — AB (ref 0.44–1.00)
GFR, EST AFRICAN AMERICAN: 36 mL/min — AB (ref >60)
GFR, EST NON AFRICAN AMERICAN: 31 mL/min — AB (ref >60)
Glucose, Bld: 131 mg/dL — ABNORMAL HIGH (ref 65–99)
Potassium: 5.3 mmol/L — ABNORMAL HIGH (ref 3.5–5.1)
Sodium: 133 mmol/L — ABNORMAL LOW (ref 135–145)
TOTAL PROTEIN: 5.1 g/dL — AB (ref 6.5–8.1)

## 2016-04-22 LAB — I-STAT CG4 LACTIC ACID, ED: LACTIC ACID, VENOUS: 2.12 mmol/L — AB (ref 0.5–1.9)

## 2016-04-22 MED ORDER — VANCOMYCIN HCL IN DEXTROSE 1-5 GM/200ML-% IV SOLN
1000.0000 mg | Freq: Once | INTRAVENOUS | Status: AC
  Administered 2016-04-22: 1000 mg via INTRAVENOUS
  Filled 2016-04-22: qty 200

## 2016-04-22 MED ORDER — DEXTROSE 5 % IV SOLN
2.0000 g | Freq: Once | INTRAVENOUS | Status: AC
  Administered 2016-04-22: 2 g via INTRAVENOUS
  Filled 2016-04-22: qty 2

## 2016-04-22 MED ORDER — FUROSEMIDE 10 MG/ML IJ SOLN
20.0000 mg | Freq: Once | INTRAMUSCULAR | Status: AC
Start: 2016-04-22 — End: 2016-04-22
  Administered 2016-04-22: 20 mg via INTRAVENOUS
  Filled 2016-04-22: qty 2

## 2016-04-22 MED ORDER — VANCOMYCIN HCL IN DEXTROSE 1-5 GM/200ML-% IV SOLN
1000.0000 mg | INTRAVENOUS | Status: DC
  Administered 2016-04-24: 1000 mg via INTRAVENOUS
  Filled 2016-04-22 (×2): qty 200

## 2016-04-22 MED ORDER — DIPHENHYDRAMINE HCL 50 MG/ML IJ SOLN
12.5000 mg | Freq: Once | INTRAMUSCULAR | Status: AC
  Administered 2016-04-22: 12.5 mg via INTRAVENOUS
  Filled 2016-04-22: qty 1

## 2016-04-22 MED ORDER — SODIUM CHLORIDE 0.9 % IV BOLUS (SEPSIS): 1000.0000 mL | Freq: Once | INTRAVENOUS | Status: DC

## 2016-04-22 MED ORDER — ASPIRIN 81 MG PO CHEW
324.0000 mg | CHEWABLE_TABLET | Freq: Once | ORAL | Status: AC
  Administered 2016-04-22: 324 mg via ORAL
  Filled 2016-04-22: qty 4

## 2016-04-22 MED ORDER — DEXTROSE 5 % IV SOLN
1.0000 g | Freq: Two times a day (BID) | INTRAVENOUS | Status: DC
  Administered 2016-04-23 (×2): 1 g via INTRAVENOUS
  Filled 2016-04-22 (×4): qty 1

## 2016-04-22 MED ORDER — NITROGLYCERIN 2 % TD OINT
0.5000 [in_us] | TOPICAL_OINTMENT | Freq: Four times a day (QID) | TRANSDERMAL | Status: DC
  Administered 2016-04-22 – 2016-05-06 (×53): 0.5 [in_us] via TOPICAL
  Filled 2016-04-22 (×8): qty 30
  Filled 2016-04-22: qty 1
  Filled 2016-04-22 (×6): qty 30

## 2016-04-22 MED ORDER — PIPERACILLIN-TAZOBACTAM 3.375 G IVPB 30 MIN: 3.3750 g | Freq: Once | INTRAVENOUS | Status: DC

## 2016-04-22 NOTE — ED Provider Notes (Signed)
[facility]-EMERGENCY DEPT Provider Note   CSN: [id number] Arrival date & time: 04/22/16  2055     History   Chief Complaint Chief Complaint  Patient presents with  . Shortness of Breath    HPI Katherine Chapman is a 82 y.o. female.   Shortness of Breath  This is a new problem. The problem occurs continuously.The problem has been gradually worsening. Associated symptoms include leg swelling. Pertinent negatives include no fever, no rhinorrhea, no sore throat, no ear pain, no cough, no sputum production, no chest pain, no vomiting, no abdominal pain and no rash. Risk factors: CHF. Treatments tried: CPAP. The treatment provided moderate relief.    Past Medical History:  Diagnosis Date  . CHF (congestive heart failure) (HCC)   . History of loop recorder   . Hypertension   . Stroke [facility](HCC)     Patient Active Problem List   Diagnosis Date Noted  . Essential hypertension 08/12/2014  . Chronic kidney disease, stage III (moderate) 08/12/2014  . Acute renal failure (HCC) 08/12/2014  . Dementia 08/12/2014  . History of stroke 08/12/2014  . Hypokalemia 08/12/2014  . Syncope, brief 08/11/2014  . History of loop recorder 08/11/2014  . Cellulitis 07/21/2014    Past Surgical History:  Procedure Laterality Date  . EP IMPLANTABLE DEVICE     loop recorder  . TEE WITHOUT CARDIOVERSION N/A 08/14/2014   Procedure: TRANSESOPHAGEAL ECHOCARDIOGRAM (TEE);  Surgeon: Lewayne BuntingBrian S Crenshaw, MD;  [facility];  Service: Cardiovascular;  Laterality: N/A;    OB History    No data available       Home Medications    Prior to Admission medications   Medication Sig Start Date End Date Taking? Authorizing Provider  allopurinol (ZYLOPRIM) 100 MG tablet Take 1 tablet (100 mg total) by mouth daily. 07/23/14  Yes Richarda OverlieNayana Abrol, MD  aspirin EC 81 MG tablet Take 81 mg by mouth daily.   Yes Historical Provider, MD  budesonide-formoterol (SYMBICORT) 160-4.5 MCG/ACT inhaler Inhale 2 puffs into the lungs 2 (two)  times daily. Patient taking differently: Inhale 2 puffs into the lungs 2 (two) times daily as needed (shortness of breath).  07/23/14  Yes Richarda OverlieNayana Abrol, MD  carvedilol (COREG) 25 MG tablet Take 1 tablet (25 mg total) by mouth 2 (two) times daily with a meal. 07/23/14  Yes Richarda OverlieNayana Abrol, MD  furosemide (LASIX) 20 MG tablet Take 1 tablet (20 mg total) by mouth daily. Resume only after 4 days. 08/12/14  Yes Osvaldo ShipperGokul Krishnan, MD  hydrALAZINE (APRESOLINE) 50 MG tablet Take 1 tablet (50 mg total) by mouth 3 (three) times daily. 08/12/14  Yes Osvaldo ShipperGokul Krishnan, MD  isosorbide mononitrate (IMDUR) 60 MG 24 hr tablet Take 1 tablet (60 mg total) by mouth daily. 08/12/14  Yes Osvaldo ShipperGokul Krishnan, MD  polyvinyl alcohol (LIQUIFILM TEARS) 1.4 % ophthalmic solution Place 1 drop into the right eye 3 (three) times daily as needed for dry eyes. 08/15/14  Yes Osvaldo ShipperGokul Krishnan, MD    Family History History reviewed. No pertinent family history.  Social History Social History  Substance Use Topics  . Smoking status: Current Some Day Smoker  . Smokeless tobacco: Not on file  . Alcohol use No     Allergies   Penicillins   Review of Systems Review of Systems  Constitutional: Positive for fatigue. Negative for chills and fever.  HENT: Negative for ear pain, rhinorrhea and sore throat.   Eyes: Negative for pain and visual disturbance.  Respiratory: Positive for shortness of breath. Negative  for cough and sputum production.   Cardiovascular: Positive for leg swelling. Negative for chest pain and palpitations.  Gastrointestinal: Negative for abdominal pain and vomiting.  Genitourinary: Negative for dysuria and hematuria.  Musculoskeletal: Negative for arthralgias and back pain.  Skin: Negative for color change and rash.  Neurological: Negative for seizures and syncope.  All other systems reviewed and are negative.    Physical Exam Updated Vital Signs BP 128/76   Pulse 86   Temp 98.7 F (37.1 C) (Rectal)   Resp 24    SpO2 96%   Physical Exam  Constitutional: She appears well-developed and well-nourished. No distress.  HENT:  Head: Normocephalic and atraumatic.  Eyes: Conjunctivae are normal.  Neck: Neck supple.  Cardiovascular: Normal rate and regular rhythm.   No murmur heard. Pulmonary/Chest: Accessory muscle usage present. Tachypnea noted. No respiratory distress. She has decreased breath sounds. She has rales.  Abdominal: Soft. There is no tenderness.  Musculoskeletal: She exhibits no edema.  Diffusely swollen upper and lower extremities. Scattered areas of skin breakdown, on the upper and lower extremities as well as the trunk, consistent with grade 2-3 ulcers. The worse of which on her buttock and sacrum.  Neurological: She is alert.  Skin: Skin is warm and dry.  Psychiatric: She has a normal mood and affect.  Nursing note and vitals reviewed.    ED Treatments / Results  Labs (all labs ordered are listed, but only abnormal results are displayed) Labs Reviewed  COMPREHENSIVE METABOLIC PANEL - Abnormal; Notable for the following:       Result Value   Sodium 133 (*)    Potassium 5.3 (*)    CO2 18 (*)    Glucose, Bld 131 (*)    BUN 36 (*)    Creatinine, Ser 1.50 (*)    Calcium 7.8 (*)    Total Protein 5.1 (*)    Albumin 1.4 (*)    AST 69 (*)    GFR calc non Af Amer 31 (*)    GFR calc Af Amer 36 (*)    All other components within normal limits  CBC WITH DIFFERENTIAL/PLATELET - Abnormal; Notable for the following:    Hemoglobin 10.0 (*)    HCT 29.6 (*)    MCV 71.5 (*)    MCH 24.2 (*)    All other components within normal limits  BRAIN NATRIURETIC PEPTIDE - Abnormal; Notable for the following:    B Natriuretic Peptide 1,908.7 (*)    All other components within normal limits  I-STAT CG4 LACTIC ACID, ED - Abnormal; Notable for the following:    Lactic Acid, Venous 2.12 (*)    All other components within normal limits  I-STAT TROPOININ, ED - Abnormal; Notable for the following:     Troponin i, poc 0.09 (*)    All other components within normal limits  I-STAT CG4 LACTIC ACID, ED - Abnormal; Notable for the following:    Lactic Acid, Venous 2.61 (*)    All other components within normal limits  I-STAT TROPOININ, ED - Abnormal; Notable for the following:    Troponin i, poc 0.09 (*)    All other components within normal limits  CULTURE, BLOOD (ROUTINE X 2)  CULTURE, BLOOD (ROUTINE X 2)  URINE CULTURE  URINALYSIS, ROUTINE W REFLEX MICROSCOPIC (NOT AT [facility])    EKG  EKG Interpretation  Date/Time:  Tuesday April 22 2016 21:12:19 EST Ventricular Rate:  112 PR Interval:    QRS Duration: 99 QT Interval:  291  QTC Calculation: 398 R Axis:   51 Text Interpretation:  Sinus tachycardia Multiple premature complexes, vent & supraven Borderline low voltage, extremity leads Anteroseptal infarct, old Repol abnrm suggests ischemia, anterolateral Baseline wander in lead(s) I When compared with ECG of 04/01/2015, T wave inversion Anterolateral leads is now Present Confirmed by [facility]  MD, DAVID ([id number]) on 04/22/2016 9:42:39 PM       Radiology Dg Chest Port 1 View  Result Date: 04/22/2016 CLINICAL DATA:  Acute onset of shortness of breath. Initial encounter. EXAM: PORTABLE CHEST 1 VIEW COMPARISON:  Chest radiograph performed 08/11/2014 FINDINGS: The lungs are well-aerated. Small bilateral pleural effusions are noted. Mild bibasilar opacities may reflect mild interstitial edema. No pneumothorax is seen. The cardiomediastinal silhouette is mildly enlarged. A metallic device is noted overlying the mediastinum. No acute osseous abnormalities are seen. IMPRESSION: Small bilateral pleural effusions. Mild bibasilar airspace opacities may reflect mild interstitial edema. Mild cardiomegaly. Electronically Signed   By: Roanna Raider M.D.   On: 04/22/2016 22:38    Procedures Procedures (including critical care time)  Medications Ordered in ED Medications  nitroGLYCERIN (NITROGLYN) 2 %  ointment 0.5 inch (0.5 inches Topical Given 04/22/16 2210)  vancomycin (VANCOCIN) IVPB 1000 mg/200 mL premix (1,000 mg Intravenous New Bag/Given 04/22/16 2356)  vancomycin (VANCOCIN) IVPB 1000 mg/200 mL premix (not administered)  cefTAZidime (FORTAZ) 1 g in dextrose 5 % 50 mL IVPB (not administered)  aspirin chewable tablet 324 mg (324 mg Oral Given 04/22/16 2208)  diphenhydrAMINE (BENADRYL) injection 12.5 mg (12.5 mg Intravenous Given 04/22/16 2353)  furosemide (LASIX) injection 20 mg (20 mg Intravenous Given 04/22/16 2353)  cefTAZidime (FORTAZ) 2 g in dextrose 5 % 50 mL IVPB (2 g Intravenous New Bag/Given 04/22/16 2355)  furosemide (LASIX) injection 20 mg (20 mg Intravenous Given 04/23/16 0021)     Initial Impression / Assessment and Plan / ED Course  I have reviewed the triage vital signs and the nursing notes.  Pertinent labs & imaging results that were available during my care of the patient were reviewed by me and considered in my medical decision making (see chart for details).  Clinical Course     82 year old female past medical history of kidney dysfunction heart failure. She comes today shortness of breath. Increase work of breathing. She is on CPAP by EMS. Per her son she is slightly improved and she is weaned off CPAP and put on nasal cannula, currently on 5 L. This patient has diffusely swollen upper and lower extremities, with 3+ pitting edema. Bibasilar rales. Is clinically fluid overloaded. She is mildly hypertensive insetting the heart failure exacerbation nitroglycerin was placed, 1/2 inch. Blood pressure responds well. She's also given IV Lasix 40 mg. She has mild troponin elevation likely in the setting of demand ischemia and heart failure exacerbation. With the chronic wounds her chronic incontinence and concerns for possible urinary tract infection she was given antibiotics. Patient will be admitted to the hospitalist team. Vital signs are stable time and off. Limited this  patient's care please see hospitalist team notes.  Final Clinical Impressions(s) / ED Diagnoses   Final diagnoses:  SOB (shortness of breath)    New Prescriptions New Prescriptions   No medications on file     Cherlynn Perches, MD 04/23/16 0044    Dione Booze, MD 04/23/16 2316

## 2016-04-22 NOTE — ED Triage Notes (Addendum)
Per EMS: #4 days increased SOB. Increased edema in lower extremities. Increased edema in LUE. Rales in upper airway. Sat 90% w/o CPAP.100% with CPAP Pt has Dementia and been out of medication for couple weeks.  Pt is from home where son takes care of her. Pt has PU on bottom. +3 pitting edema in legs. + 3 in LUE. Pt knows she is at the hospital. Pt put on [place] at 4L

## 2016-04-22 NOTE — Progress Notes (Signed)
Pharmacy Antibiotic Note  Katherine Chapman is a 82 y.o. female admitted on 04/22/2016 with SOB, edema in lower extremities. Planning to start broad spectrum antibiotics for empiric coverage of possible urinary source/pneumonia/bed sores. LA 2.1, WBC wnl, afebrile, SCr 1.5, eCrCl 30-35 ml/min.   Plan: -Vancomycin 1 g IV x1 then 1g/24h -Ceftazidime 2 g IV x1 then 1g/12h -Monitor renal fx, cultures, VT as indicated    No data recorded.   Recent Labs Lab 04/22/16 2115 04/22/16 2153  WBC 8.2  --   CREATININE 1.50*  --   LATICACIDVEN  --  2.12*    CrCl cannot be calculated (Unknown ideal weight.).    Allergies  Allergen Reactions  . Penicillins Hives    Antimicrobials this admission: 11/21 vancomycin > 11/21 ceftazidime >  Dose adjustments this admission: NA  Microbiology results: 11/21 blood cx: 11/21 urine cx:  Thank you for allowing pharmacy to be a part of this patient's care.  Baldemar FridayMasters, Katherine Chapman 04/22/2016 10:41 PM

## 2016-04-22 NOTE — Plan of Care (Signed)
RT note: Pt. arrived on CPAP via [facility], taken off once moved to bed to assess respiratory status/WOB placed on 4 lpm n/c-96% sats, RR-18-20, b/l b.s <'d/moist, mild WOB, BiPAP placed in room just prior to pts. arrival, remains on stand-by, RT to monitor.

## 2016-04-23 ENCOUNTER — Inpatient Hospital Stay (HOSPITAL_COMMUNITY): Payer: Medicare Other

## 2016-04-23 ENCOUNTER — Encounter (HOSPITAL_COMMUNITY): Payer: Self-pay

## 2016-04-23 DIAGNOSIS — R748 Abnormal levels of other serum enzymes: Secondary | ICD-10-CM | POA: Diagnosis not present

## 2016-04-23 DIAGNOSIS — N390 Urinary tract infection, site not specified: Secondary | ICD-10-CM | POA: Diagnosis present

## 2016-04-23 DIAGNOSIS — B9620 Unspecified Escherichia coli [E. coli] as the cause of diseases classified elsewhere: Secondary | ICD-10-CM | POA: Diagnosis present

## 2016-04-23 DIAGNOSIS — D696 Thrombocytopenia, unspecified: Secondary | ICD-10-CM | POA: Diagnosis not present

## 2016-04-23 DIAGNOSIS — Z66 Do not resuscitate: Secondary | ICD-10-CM | POA: Diagnosis not present

## 2016-04-23 DIAGNOSIS — Z22322 Carrier or suspected carrier of Methicillin resistant Staphylococcus aureus: Secondary | ICD-10-CM | POA: Diagnosis not present

## 2016-04-23 DIAGNOSIS — E872 Acidosis: Secondary | ICD-10-CM | POA: Diagnosis present

## 2016-04-23 DIAGNOSIS — N183 Chronic kidney disease, stage 3 (moderate): Secondary | ICD-10-CM

## 2016-04-23 DIAGNOSIS — A4159 Other Gram-negative sepsis: Secondary | ICD-10-CM | POA: Diagnosis present

## 2016-04-23 DIAGNOSIS — I272 Pulmonary hypertension, unspecified: Secondary | ICD-10-CM | POA: Diagnosis present

## 2016-04-23 DIAGNOSIS — Z681 Body mass index (BMI) 19 or less, adult: Secondary | ICD-10-CM | POA: Diagnosis not present

## 2016-04-23 DIAGNOSIS — I5022 Chronic systolic (congestive) heart failure: Secondary | ICD-10-CM | POA: Diagnosis not present

## 2016-04-23 DIAGNOSIS — L89320 Pressure ulcer of left buttock, unstageable: Secondary | ICD-10-CM | POA: Diagnosis present

## 2016-04-23 DIAGNOSIS — R0602 Shortness of breath: Secondary | ICD-10-CM | POA: Diagnosis present

## 2016-04-23 DIAGNOSIS — F0390 Unspecified dementia without behavioral disturbance: Secondary | ICD-10-CM | POA: Diagnosis not present

## 2016-04-23 DIAGNOSIS — R7881 Bacteremia: Secondary | ICD-10-CM | POA: Diagnosis not present

## 2016-04-23 DIAGNOSIS — Z7189 Other specified counseling: Secondary | ICD-10-CM | POA: Diagnosis not present

## 2016-04-23 DIAGNOSIS — F0150 Vascular dementia without behavioral disturbance: Secondary | ICD-10-CM | POA: Diagnosis not present

## 2016-04-23 DIAGNOSIS — E875 Hyperkalemia: Secondary | ICD-10-CM | POA: Diagnosis present

## 2016-04-23 DIAGNOSIS — I5021 Acute systolic (congestive) heart failure: Secondary | ICD-10-CM

## 2016-04-23 DIAGNOSIS — E43 Unspecified severe protein-calorie malnutrition: Secondary | ICD-10-CM | POA: Diagnosis present

## 2016-04-23 DIAGNOSIS — I10 Essential (primary) hypertension: Secondary | ICD-10-CM | POA: Diagnosis not present

## 2016-04-23 DIAGNOSIS — I248 Other forms of acute ischemic heart disease: Secondary | ICD-10-CM | POA: Diagnosis present

## 2016-04-23 DIAGNOSIS — R64 Cachexia: Secondary | ICD-10-CM | POA: Diagnosis present

## 2016-04-23 DIAGNOSIS — I509 Heart failure, unspecified: Secondary | ICD-10-CM | POA: Diagnosis not present

## 2016-04-23 DIAGNOSIS — R778 Other specified abnormalities of plasma proteins: Secondary | ICD-10-CM | POA: Diagnosis present

## 2016-04-23 DIAGNOSIS — L89310 Pressure ulcer of right buttock, unstageable: Secondary | ICD-10-CM | POA: Diagnosis present

## 2016-04-23 DIAGNOSIS — R7989 Other specified abnormal findings of blood chemistry: Secondary | ICD-10-CM

## 2016-04-23 DIAGNOSIS — I313 Pericardial effusion (noninflammatory): Secondary | ICD-10-CM | POA: Diagnosis present

## 2016-04-23 DIAGNOSIS — L89112 Pressure ulcer of right upper back, stage 2: Secondary | ICD-10-CM | POA: Diagnosis present

## 2016-04-23 DIAGNOSIS — E440 Moderate protein-calorie malnutrition: Secondary | ICD-10-CM | POA: Diagnosis not present

## 2016-04-23 DIAGNOSIS — L8990 Pressure ulcer of unspecified site, unspecified stage: Secondary | ICD-10-CM | POA: Diagnosis present

## 2016-04-23 DIAGNOSIS — Z515 Encounter for palliative care: Secondary | ICD-10-CM | POA: Diagnosis not present

## 2016-04-23 DIAGNOSIS — E8809 Other disorders of plasma-protein metabolism, not elsewhere classified: Secondary | ICD-10-CM | POA: Diagnosis not present

## 2016-04-23 DIAGNOSIS — L89623 Pressure ulcer of left heel, stage 3: Secondary | ICD-10-CM | POA: Diagnosis present

## 2016-04-23 DIAGNOSIS — L89322 Pressure ulcer of left buttock, stage 2: Secondary | ICD-10-CM | POA: Diagnosis present

## 2016-04-23 DIAGNOSIS — I130 Hypertensive heart and chronic kidney disease with heart failure and stage 1 through stage 4 chronic kidney disease, or unspecified chronic kidney disease: Secondary | ICD-10-CM | POA: Diagnosis present

## 2016-04-23 DIAGNOSIS — L89312 Pressure ulcer of right buttock, stage 2: Secondary | ICD-10-CM | POA: Diagnosis present

## 2016-04-23 DIAGNOSIS — J9601 Acute respiratory failure with hypoxia: Secondary | ICD-10-CM | POA: Diagnosis present

## 2016-04-23 LAB — BLOOD CULTURE ID PANEL (REFLEXED)
Acinetobacter baumannii: NOT DETECTED
CANDIDA GLABRATA: NOT DETECTED
CANDIDA TROPICALIS: NOT DETECTED
CARBAPENEM RESISTANCE: NOT DETECTED
Candida albicans: NOT DETECTED
Candida krusei: NOT DETECTED
Candida parapsilosis: NOT DETECTED
ENTEROCOCCUS SPECIES: NOT DETECTED
ESCHERICHIA COLI: NOT DETECTED
Enterobacter cloacae complex: NOT DETECTED
Enterobacteriaceae species: DETECTED — AB
Haemophilus influenzae: NOT DETECTED
Klebsiella oxytoca: NOT DETECTED
Klebsiella pneumoniae: NOT DETECTED
LISTERIA MONOCYTOGENES: NOT DETECTED
NEISSERIA MENINGITIDIS: NOT DETECTED
PROTEUS SPECIES: DETECTED — AB
Pseudomonas aeruginosa: NOT DETECTED
SERRATIA MARCESCENS: NOT DETECTED
STAPHYLOCOCCUS AUREUS BCID: NOT DETECTED
STAPHYLOCOCCUS SPECIES: NOT DETECTED
STREPTOCOCCUS AGALACTIAE: NOT DETECTED
Streptococcus pneumoniae: NOT DETECTED
Streptococcus pyogenes: NOT DETECTED
Streptococcus species: NOT DETECTED

## 2016-04-23 LAB — URINALYSIS, ROUTINE W REFLEX MICROSCOPIC
BILIRUBIN URINE: NEGATIVE
Bilirubin Urine: NEGATIVE
GLUCOSE, UA: NEGATIVE mg/dL
GLUCOSE, UA: NEGATIVE mg/dL
HGB URINE DIPSTICK: NEGATIVE
Hgb urine dipstick: NEGATIVE
KETONES UR: NEGATIVE mg/dL
Ketones, ur: NEGATIVE mg/dL
Nitrite: NEGATIVE
Nitrite: POSITIVE — AB
PROTEIN: NEGATIVE mg/dL
PROTEIN: 30 mg/dL — AB
SPECIFIC GRAVITY, URINE: 1.015 (ref 1.005–1.030)
Specific Gravity, Urine: 1.008 (ref 1.005–1.030)
pH: 5 (ref 5.0–8.0)
pH: 5 (ref 5.0–8.0)

## 2016-04-23 LAB — COMPREHENSIVE METABOLIC PANEL
ALBUMIN: 1.3 g/dL — AB (ref 3.5–5.0)
ALK PHOS: 66 U/L (ref 38–126)
ALT: 29 U/L (ref 14–54)
AST: 55 U/L — AB (ref 15–41)
Anion gap: 5 (ref 5–15)
BILIRUBIN TOTAL: 0.2 mg/dL — AB (ref 0.3–1.2)
BUN: 31 mg/dL — AB (ref 6–20)
CALCIUM: 8.1 mg/dL — AB (ref 8.9–10.3)
CO2: 22 mmol/L (ref 22–32)
CREATININE: 1.3 mg/dL — AB (ref 0.44–1.00)
Chloride: 109 mmol/L (ref 101–111)
GFR calc Af Amer: 43 mL/min — ABNORMAL LOW (ref >60)
GFR, EST NON AFRICAN AMERICAN: 37 mL/min — AB (ref >60)
GLUCOSE: 93 mg/dL (ref 65–99)
POTASSIUM: 4.3 mmol/L (ref 3.5–5.1)
Sodium: 136 mmol/L (ref 135–145)
TOTAL PROTEIN: 4.4 g/dL — AB (ref 6.5–8.1)

## 2016-04-23 LAB — URINE MICROSCOPIC-ADD ON: RBC / HPF: NONE SEEN RBC/hpf (ref 0–5)

## 2016-04-23 LAB — I-STAT TROPONIN, ED: TROPONIN I, POC: 0.09 ng/mL — AB (ref 0.00–0.08)

## 2016-04-23 LAB — I-STAT CG4 LACTIC ACID, ED: Lactic Acid, Venous: 2.61 mmol/L (ref 0.5–1.9)

## 2016-04-23 LAB — PREALBUMIN

## 2016-04-23 LAB — CBC WITH DIFFERENTIAL/PLATELET
BASOS PCT: 0 %
Basophils Absolute: 0 10*3/uL (ref 0.0–0.1)
EOS PCT: 0 %
Eosinophils Absolute: 0 10*3/uL (ref 0.0–0.7)
HEMATOCRIT: 26 % — AB (ref 36.0–46.0)
Hemoglobin: 8.6 g/dL — ABNORMAL LOW (ref 12.0–15.0)
LYMPHS ABS: 1 10*3/uL (ref 0.7–4.0)
Lymphocytes Relative: 9 %
MCH: 23.5 pg — AB (ref 26.0–34.0)
MCHC: 33.1 g/dL (ref 30.0–36.0)
MCV: 71 fL — AB (ref 78.0–100.0)
MONO ABS: 0.7 10*3/uL (ref 0.1–1.0)
Monocytes Relative: 6 %
Neutro Abs: 9.3 10*3/uL — ABNORMAL HIGH (ref 1.7–7.7)
Neutrophils Relative %: 85 %
Platelets: 134 10*3/uL — ABNORMAL LOW (ref 150–400)
RBC: 3.66 MIL/uL — ABNORMAL LOW (ref 3.87–5.11)
RDW: 13.7 % (ref 11.5–15.5)
WBC: 11 10*3/uL — ABNORMAL HIGH (ref 4.0–10.5)

## 2016-04-23 LAB — LACTIC ACID, PLASMA
Lactic Acid, Venous: 2.5 mmol/L (ref 0.5–1.9)
Lactic Acid, Venous: 2.1 mmol/L (ref 0.5–1.9)

## 2016-04-23 LAB — ECHOCARDIOGRAM COMPLETE: Weight: 2128 oz

## 2016-04-23 LAB — TSH: TSH: 4.26 u[IU]/mL (ref 0.350–4.500)

## 2016-04-23 LAB — TROPONIN I
TROPONIN I: 0.1 ng/mL — AB (ref <0.03)
TROPONIN I: 0.08 ng/mL — AB (ref <0.03)
Troponin I: 0.08 ng/mL (ref <0.03)

## 2016-04-23 MED ORDER — SODIUM CHLORIDE 0.9 % IV SOLN
250.0000 mL | INTRAVENOUS | Status: DC | PRN
  Administered 2016-04-26: 250 mL via INTRAVENOUS

## 2016-04-23 MED ORDER — ONDANSETRON HCL 4 MG/2ML IJ SOLN: 4.0000 mg | Freq: Four times a day (QID) | INTRAMUSCULAR | Status: DC | PRN

## 2016-04-23 MED ORDER — ACETAMINOPHEN 325 MG PO TABS
650.0000 mg | ORAL_TABLET | ORAL | Status: DC | PRN
  Administered 2016-04-29: 650 mg via ORAL
  Filled 2016-04-23: qty 2

## 2016-04-23 MED ORDER — ADULT MULTIVITAMIN W/MINERALS CH
1.0000 | ORAL_TABLET | Freq: Every day | ORAL | Status: DC
  Administered 2016-04-23 – 2016-05-06 (×13): 1 via ORAL
  Filled 2016-04-23 (×15): qty 1

## 2016-04-23 MED ORDER — PRO-STAT SUGAR FREE PO LIQD
30.0000 mL | Freq: Two times a day (BID) | ORAL | Status: DC
  Administered 2016-04-23 – 2016-05-05 (×23): 30 mL via ORAL
  Filled 2016-04-23 (×23): qty 30

## 2016-04-23 MED ORDER — FUROSEMIDE 10 MG/ML IJ SOLN
20.0000 mg | Freq: Once | INTRAMUSCULAR | Status: AC
  Administered 2016-04-23: 20 mg via INTRAVENOUS
  Filled 2016-04-23: qty 2

## 2016-04-23 MED ORDER — ACETAMINOPHEN 325 MG PO TABS: 650.0000 mg | ORAL_TABLET | Freq: Four times a day (QID) | ORAL | Status: DC | PRN

## 2016-04-23 MED ORDER — ENOXAPARIN SODIUM 30 MG/0.3ML ~~LOC~~ SOLN
30.0000 mg | SUBCUTANEOUS | Status: DC
  Administered 2016-04-23 – 2016-05-06 (×14): 30 mg via SUBCUTANEOUS
  Filled 2016-04-23 (×14): qty 0.3

## 2016-04-23 MED ORDER — ENSURE ENLIVE PO LIQD
237.0000 mL | Freq: Two times a day (BID) | ORAL | Status: DC
  Administered 2016-04-23 – 2016-05-05 (×25): 237 mL via ORAL

## 2016-04-23 MED ORDER — ARFORMOTEROL TARTRATE 15 MCG/2ML IN NEBU
15.0000 ug | INHALATION_SOLUTION | Freq: Two times a day (BID) | RESPIRATORY_TRACT | Status: DC
  Administered 2016-04-23 – 2016-05-06 (×25): 15 ug via RESPIRATORY_TRACT
  Filled 2016-04-23 (×29): qty 2

## 2016-04-23 MED ORDER — COLLAGENASE 250 UNIT/GM EX OINT
TOPICAL_OINTMENT | Freq: Every day | CUTANEOUS | Status: DC
  Administered 2016-04-23 – 2016-04-27 (×6): via TOPICAL
  Administered 2016-04-28: 1 via TOPICAL
  Administered 2016-04-29 – 2016-05-06 (×8): via TOPICAL
  Filled 2016-04-23 (×3): qty 30

## 2016-04-23 MED ORDER — FUROSEMIDE 10 MG/ML IJ SOLN
40.0000 mg | Freq: Two times a day (BID) | INTRAMUSCULAR | Status: DC
  Administered 2016-04-23 – 2016-04-25 (×5): 40 mg via INTRAVENOUS
  Filled 2016-04-23 (×5): qty 4

## 2016-04-23 MED ORDER — SODIUM CHLORIDE 0.9% FLUSH
3.0000 mL | Freq: Two times a day (BID) | INTRAVENOUS | Status: DC
  Administered 2016-04-23 – 2016-05-06 (×26): 3 mL via INTRAVENOUS

## 2016-04-23 MED ORDER — SODIUM CHLORIDE 0.9% FLUSH: 3.0000 mL | INTRAVENOUS | Status: DC | PRN

## 2016-04-23 MED ORDER — IPRATROPIUM-ALBUTEROL 0.5-2.5 (3) MG/3ML IN SOLN
3.0000 mL | RESPIRATORY_TRACT | Status: DC | PRN
  Administered 2016-05-02: 3 mL via RESPIRATORY_TRACT
  Filled 2016-04-23: qty 3

## 2016-04-23 MED ORDER — SODIUM CHLORIDE 0.9 % IV SOLN
1.0000 g | Freq: Once | INTRAVENOUS | Status: AC
  Administered 2016-04-23: 1 g via INTRAVENOUS
  Filled 2016-04-23: qty 10

## 2016-04-23 MED ORDER — BUDESONIDE 0.5 MG/2ML IN SUSP
0.5000 mg | Freq: Two times a day (BID) | RESPIRATORY_TRACT | Status: DC
  Administered 2016-04-23 – 2016-05-06 (×25): 0.5 mg via RESPIRATORY_TRACT
  Filled 2016-04-23 (×27): qty 2

## 2016-04-23 MED ORDER — DEXTROSE 5 % IV SOLN
2.0000 g | INTRAVENOUS | Status: AC
  Administered 2016-04-24 – 2016-05-05 (×12): 2 g via INTRAVENOUS
  Filled 2016-04-23 (×12): qty 2

## 2016-04-23 NOTE — Progress Notes (Signed)
Initial Nutrition Assessment  DOCUMENTATION CODES:   Non-severe (moderate) malnutrition in context of chronic illness  INTERVENTION:  Continue Ensure Enlive po BID, each supplement provides 350 kcal and 20 grams of protein Provide 30 ml Pro-stat BID, each dose provides 15 grams of protein and 100 kcal Provide Magic cup BID with meals, each supplement provides 290 kcal and 9 grams of protein Provide Multivitamin with minerals daily   NUTRITION DIAGNOSIS:   Increased nutrient needs related to wound healing as evidenced by estimated needs.   GOAL:   Patient will meet greater than or equal to 90% of their needs   MONITOR:   PO intake, Supplement acceptance, Labs, Weight trends, Skin, I & O's  REASON FOR ASSESSMENT:   Malnutrition Screening Tool    ASSESSMENT:   82 y.o. female with medical history significant of HTN, CHF, CVA, and dementia nonverbal; who presents with progressively worsening shortness of breath over the last 4 days. Associated symptoms include increased edema all over, bedsores, and poor appetite.  Pt asleep and curled up at time of visit. Severe muscle wasting noted in temples, clavicles, acromion region, and scapula. Mild fat wasting noted in arms and orbital region. Edema may be masking additional masking in arms and legs. Per nursing notes pt ate 25% of breakfast. No family at bedside at time of visit. Ensure Enlive unopened on bedside table. RD unable to awake patient. Weight history shows 7 lbs weight loss in past year, but suspect edema is masking additional weight loss.   Labs: low hemoglobin, elevated lactic acid, low calcium  Diet Order:  Diet Heart Room service appropriate? Yes; Fluid consistency: Thin  Skin:  Wound (see comment) (stage II pressure injury on R arm and L butt; stage III pressure injury on R butt)  Last BM:  PTA  Height:   Ht Readings from Last 1 Encounters:  04/01/15 5\' 7"  (1.702 m)    Weight:   Wt Readings from Last 1  Encounters:  04/23/16 133 lb (60.3 kg)    Ideal Body Weight:     BMI:  Body mass index is 20.83 kg/m.  Estimated Nutritional Needs:   Kcal:  1500-1700  Protein:  85-95 grams  Fluid:  1.7 L/day  EDUCATION NEEDS:   No education needs identified at this time  Dorothea Ogleeanne Poetry Cerro RD, CSP, LDN Inpatient Clinical Dietitian Pager: [phone] After Hours Pager: [phone]

## 2016-04-23 NOTE — Consult Note (Addendum)
WOC Nurse wound consult note Reason for Consult: Consult requested for multiple wounds.  Pt is contracted, immobile, dehibilitated, and constantly incontinent of urine. It is not possible to keep wounds from becoming soiled.  Wound type: Multiple pressure injuries as follows: Pt has generalized edema to bilat legs and feet and there are multiple areas of patchy partial thickness skin loss to anterior and posterior calves, yellow wound beds with yellow drainage, no odor. There are some areas of full thickness wounds; left anterior foot .2X.2X.2cm, yellow moist wound bed, posterior leg, .2X.1X.1cm, moist yellow wound bed Left outer foot .2X.2X.1cm, yellow and moist. Left heel with darker colored skin, deep tissue injury 2X2cm with intact skin. Left shoulder with full thickness wounds; 1X1X.1cm and .5X.5X.1cm, dry red wound beds Left elbow with unstageable pressure injury; 3X4cm, 100% yellow slough, tightly adhered, small amt yellow drainage, no odor  Left ischium 10X4.5cm unstageable pressure injury; 85% red, 10% yellow, 5% black, mod amt yellow drainage, no odor Left ischium  3X5 cm unstageable pressure injury; 90% red, 10% yellow, mod amt yellow drainage, no odor Left ischium unstageable; 4X3cm, 90% yellow, 10% red, mod amt yellow drainage, no odor, fluctuant when probed Left buttock unstageable 7X3cm, 100% yellow slough/eschar, mod amt yellow drainage, no odor Right posterior upper thigh unstageable; 6X6cm, 100% yellow slough/eschar, mod amt yellow drainage, no odor Right buttock unstageable; 8X4cm, 100% yellow slough/eschar, mod amt yellow drainage, no odor Right ischium unstageable; 6X3cm, 90% red, 10% yellow mod amt yellow drainage, no odor Right ischium unstageable; 3X5cm, 100% eschar/slough mod amt yellow drainage, no odor Right shoulder stage 2; 2X.2X.1cm, pink and moist  Multiple patchy areass of stage 2 pressure injuries and moisture associated skin damage to bilat buttocks and bilat  ischium, all are pink and moist. Pressure Ulcer POA: Yes Dressing procedure/placement/frequency: Air mattress to decrease pressure and float heels.  Santyl ointment for enzymatic debridement of nonviable tissue to left elbow, bilat ischium and buttocks. Foam dressings to protect other areas on shoulders and legs. Pt has multiple systemic factors which can impair healing.  No family members present to discuss plan of care. One hour spent performing this consult.  Pt could benefit from palliative care meeting to discuss goals of care.   Recommend foley to protect multiple bilat ischium and buttocks wounds from frequent urinary incontinence. Please order if you agree Please re-consult if further assistance is needed.  Thank-you,  Cammie Mcgeeawn Dugan Vanhoesen MSN, RN, CWOCN, [place]AP, CNS [phone]

## 2016-04-23 NOTE — Progress Notes (Signed)
Pharmacy Antibiotic Note  Lurline Harenna Trunnell is a 82 y.o. female admitted on 04/22/2016 with shortness of breath, UTI.  Pharmacy has been consulted for Ceftriaxone dosing for positive blood cultures. Blood cultures are back with "Proteus species".   Plan: -DC Ceftazidime  -Ceftriaxone 2g IV q24h -F/U length of treatment  Weight: 133 lb (60.3 kg) (bed)  Temp (24hrs), Avg:98.5 F (36.9 C), Min:98.1 F (36.7 C), Max:98.7 F (37.1 C)   Recent Labs Lab 04/22/16 2115 04/22/16 2153 04/23/16 0006 04/23/16 0143 04/23/16 0326 04/23/16 0618  WBC 8.2  --   --   --   --  11.0*  CREATININE 1.50*  --   --   --   --  1.30*  LATICACIDVEN  --  2.12* 2.61* 2.5* 2.1*  --     CrCl cannot be calculated (Unknown ideal weight.).    Allergies  Allergen Reactions  . Penicillins Hives    Abran DukeLedford, Hailie Searight 04/23/2016 11:43 PM

## 2016-04-23 NOTE — Progress Notes (Signed)
PHARMACY - PHYSICIAN COMMUNICATION CRITICAL VALUE ALERT - BLOOD CULTURE IDENTIFICATION (BCID)  Results for orders placed or performed during the hospital encounter of 04/22/16  Blood Culture ID Panel (Reflexed) (Collected: 04/22/2016 11:00 PM)  Result Value Ref Range   Enterococcus species NOT DETECTED NOT DETECTED   Listeria monocytogenes NOT DETECTED NOT DETECTED   Staphylococcus species NOT DETECTED NOT DETECTED   Staphylococcus aureus NOT DETECTED NOT DETECTED   Streptococcus species NOT DETECTED NOT DETECTED   Streptococcus agalactiae NOT DETECTED NOT DETECTED   Streptococcus pneumoniae NOT DETECTED NOT DETECTED   Streptococcus pyogenes NOT DETECTED NOT DETECTED   Acinetobacter baumannii NOT DETECTED NOT DETECTED   Enterobacteriaceae species DETECTED (A) NOT DETECTED   Enterobacter cloacae complex NOT DETECTED NOT DETECTED   Escherichia coli NOT DETECTED NOT DETECTED   Klebsiella oxytoca NOT DETECTED NOT DETECTED   Klebsiella pneumoniae NOT DETECTED NOT DETECTED   Proteus species DETECTED (A) NOT DETECTED   Serratia marcescens NOT DETECTED NOT DETECTED   Carbapenem resistance NOT DETECTED NOT DETECTED   Haemophilus influenzae NOT DETECTED NOT DETECTED   Neisseria meningitidis NOT DETECTED NOT DETECTED   Pseudomonas aeruginosa NOT DETECTED NOT DETECTED   Candida albicans NOT DETECTED NOT DETECTED   Candida glabrata NOT DETECTED NOT DETECTED   Candida krusei NOT DETECTED NOT DETECTED   Candida parapsilosis NOT DETECTED NOT DETECTED   Candida tropicalis NOT DETECTED NOT DETECTED    Name of physician (or Provider) Contacted: Maren ReamerKaren Kirby ([facility])  Changes to prescribed antibiotics required: Change Fortaz to Ceftriaxone   Abran DukeLedford, Dimitra Woodstock 04/23/2016  11:52 PM

## 2016-04-23 NOTE — Progress Notes (Signed)
Echo at bedside

## 2016-04-23 NOTE — Clinical Social Work Note (Signed)
Patient has an active APS case. CSW met with the DSS social worker, Devoria Albe. Patient has been living with her son who leaves her alone for periods of time with no phone. Patient is bed bound. There is no running water. They were about to get evicted but patient's son is reportedly getting the lease put in his name to avoid that. He was one month behind on the rent. Police were recently called to the home and patient reportedly went to the hospital soon after that due to difficulty breathing.   Patient's sister's contact information (From [place]): Darlina Sicilian [phone]  Ms. Anderson asked that [facility] print of H&P for her but Surveyor, quantity of social worker asked that she go through medical records. Ms. Ouida Sills asked that [facility] call medical records and ask them to expedite the request that has already been put in place. CSW did so and the individual that processes the requests for DSS has already left for the day. CSW will email Ms. Anderson and notify her of this.  Dayton Scrape, [place]

## 2016-04-23 NOTE — Evaluation (Signed)
Clinical/Bedside Swallow Evaluation Patient Details  Name: Katherine Chapman MRN: [id number] Date of Birth: 07/10/1930  Today's Date: 04/23/2016 Time: SLP Start Time (ACUTE ONLY): 1415 SLP Stop Time (ACUTE ONLY): 1430 SLP Time Calculation (min) (ACUTE ONLY): 15 min  Past Medical History:  Past Medical History:  Diagnosis Date  . CHF (congestive heart failure) (HCC)   . History of loop recorder   . Hypertension   . Stroke [facility](HCC)    Past Surgical History:  Past Surgical History:  Procedure Laterality Date  . EP IMPLANTABLE DEVICE     loop recorder  . TEE WITHOUT CARDIOVERSION N/A 08/14/2014   Procedure: TRANSESOPHAGEAL ECHOCARDIOGRAM (TEE);  Surgeon: Lewayne BuntingBrian S Crenshaw, MD;  [facility];  Service: Cardiovascular;  Laterality: N/A;   HPI:  82 y.o. female with medical history significant of HTN, CHF, CVA, and dementia nonverbal; who presents with progressively worsening shortness of breath over the last 4 days.   Assessment / Plan / Recommendation Clinical Impression  Bedside swallow evaluation complete.  Patient with an unremarkable oral motor exam and swift swallow trigger.  Cough noted only after consecutive straw sips, which was then again prevented with Total assist for small single sips.  Dys.2 textures resulted in prolonged but funcitonal oral phase.  Recommend diet downgrade to Dys.2 textures and continuation of thin lqiuids with small single sips.  Acute SLP to follow for toleration and advancement when dentures are present.      Aspiration Risk  Mild aspiration risk    Diet Recommendation Dysphagia 2 (Fine chop);Thin liquid   Liquid Administration via: Cup;Straw Medication Administration: Whole meds with puree Supervision: Staff to assist with self feeding;Full supervision/cueing for compensatory strategies Compensations: Minimize environmental distractions;Slow rate;Small sips/bites Postural Changes: Other (Comment) (as up right as possbile)    Other  Recommendations  Oral Care Recommendations: Oral care BID   Follow up Recommendations Skilled Nursing facility;24 hour supervision/assistance      Frequency and Duration min 2x/week  1 week       Prognosis Prognosis for Safe Diet Advancement: Fair Barriers to Reach Goals: Cognitive deficits      Swallow Study   General HPI: 82 y.o. female with medical history significant of HTN, CHF, CVA, and dementia nonverbal; who presents with progressively worsening shortness of breath over the last 4 days. Type of Study: Bedside Swallow Evaluation Previous Swallow Assessment: none on record  Diet Prior to this Study: Regular;Thin liquids Temperature Spikes Noted: No Respiratory Status: Nasal cannula History of Recent Intubation: No Behavior/Cognition: Alert;Cooperative;Pleasant mood;Requires cueing Oral Cavity Assessment: Within Functional Limits Oral Care Completed by SLP: No Oral Cavity - Dentition: Edentulous Self-Feeding Abilities: Total assist Patient Positioning: Postural control adequate for testing Baseline Vocal Quality: Normal;Other (comment) (congested ) Volitional Cough: Weak Volitional Swallow: Able to elicit    Oral/Motor/Sensory Function Overall Oral Motor/Sensory Function: Generalized oral weakness   Ice Chips Ice chips: Within functional limits   Thin Liquid Thin Liquid: Impaired Presentation: Cup;Straw Pharyngeal  Phase Impairments: Multiple swallows Other Comments: consecutive sips resulted in cough x1 removal of straw for single sips no overt s/s of aspiration     Nectar Thick Nectar Thick Liquid: Not tested   Honey Thick Honey Thick Liquid: Not tested   Puree Puree: Within functional limits Presentation: Spoon   Solid   GO   Solid: Impaired Presentation: Spoon Oral Phase Impairments: Poor awareness of bolus;Impaired mastication       Katherine FerrettiMelissa Jacelynn Chapman, M.A., CCC-SLP [phone]  Katherine Chapman 04/23/2016,5:03 PM

## 2016-04-23 NOTE — Progress Notes (Signed)
  Echocardiogram 2D Echocardiogram has been performed.  Arvil ChacoFoster, Arlen Legendre 04/23/2016, 1:25 PM

## 2016-04-23 NOTE — Progress Notes (Signed)
Nitroglycerin requested from pharmacy, none available in pyxis.

## 2016-04-23 NOTE — Progress Notes (Signed)
Patient seen and examined  82 y.o. female with medical history significant of HTN, CHF, CVA, and dementia nonverbal; who presents with progressively worsening shortness of breath over the last 4 days.  Assessment/Plan CHF exacerbation: Acute. Patient presents with shortness of breath and swelling all over. Found to have elevated BNP of 1908.7 on admission Chest x-Belland showing mild bibasilar opacities that may reflect interstitial edema and cardiomegaly. Question if patient is also third spacing due to hypoalbuminemia.  Continue telemetry bed - Heart failure protocol initiated - Strict ins and outs - Lasix 40 mg IV q 12 hours as tolerated, follow renal function closely TSH 4.2, repeat 2-D echo pending - Unclear if patient has been receiving medications of Coreg, hydralazine, isosorbide mononitrate will need to restart these when able.     Acute respiratory failure with hypoxia: Patient with - Continuous pulse oximetry K oxygen keep O2 sats greater 92% - Budesonide and Brovana nebulizer treatments -  Duonebs prn SOB/Wheezing  SIRS/Sepsis/Lactic acidosis: Patient meeting SIRS criteria with tachycardia and tachypnea on admission. Lactic acid also seen elevated at 2.12. Patient has multiple bedsores as well as urine which could likely be a source of infection.  - Trend lactic acid levels - Follow-up urinalysis and blood culture - Continue empiric antibiotics of Fortaz and vancomycin   Hyperkalemia: Potassium 5.3 on admission. Patient given IV Lasix on the ED. - Will continue to monitor, and will replace when needed  Elevated troponin : Troponin initially 0.09. Suspected this can be secondary to demand ischemia.  -Trend cardiac department    Pressure ulcers: Patient has multiple pressure ulcers of varying stages present on her whole body. - Consult wound care - Low air loss mattress replacement   Chronic kidney disease stage III: Creatinine appears near baseline. - Recheck CMP in  a.m.    Hypoalbuminemia - Check prealbumin in a.m.  Elevated AST:  AST elevated at 69 this could be secondary to passive congestion - Recheck CMP in a.m.  Essential hypertension - Continue to monitor -  add back medications as seen above when able  Dementia: Stable

## 2016-04-23 NOTE — Evaluation (Signed)
Physical Therapy Evaluation and Discharge Patient Details Name: Katherine Chapman MRN: [id number] DOB: 01/27/1931 Today's Date: 04/23/2016   History of Present Illness  Pt is an 82 y/o female admitted secondary to increased SOB, CHF exacerbation. PMH including but not limited to CHF, HTN, CVA and dementia.  Clinical Impression  Pt presented R sidelying in bed when PT entered room. Pt poor historian and unable to provide much information regarding PLOF. Pt required total A for all bed mobility this session. She was able to sit EOB for ~5 minutes with bilateral UE supports and min guard for safety. PT recommending d/c to SNF. No acute PT needs identified at this time. PT signing off.    Follow Up Recommendations SNF;Supervision/Assistance - 24 hour    Equipment Recommendations  None recommended by PT    Recommendations for Other Services       Precautions / Restrictions Precautions Precautions: Fall Restrictions Weight Bearing Restrictions: No      Mobility  Bed Mobility Overal bed mobility: Needs Assistance;+2 for physical assistance Bed Mobility: Rolling;Supine to Sit;Sit to Supine Rolling: Total assist;+2 for physical assistance   Supine to sit: +2 for physical assistance;Total assist Sit to supine: +2 for physical assistance;Total assist   General bed mobility comments: total A for all aspects  Transfers                    Ambulation/Gait                Stairs            Wheelchair Mobility    Modified Rankin (Stroke Patients Only)       Balance Overall balance assessment: Needs assistance Sitting-balance support: Feet supported;Bilateral upper extremity supported Sitting balance-Leahy Scale: Poor Sitting balance - Comments: pt required bilateral UE supports to maintain upright sitting balance                                     Pertinent Vitals/Pain Pain Assessment: No/denies pain Faces Pain Scale: Hurts little more Pain  Location: bilateral LEs Pain Descriptors / Indicators: Grimacing;Guarding Pain Intervention(s): Monitored during session;Repositioned    Home Living Family/patient expects to be discharged to:: Unsure                 Additional Comments: Pt is a poor historian with cognitive deficits at baseline.    Prior Function Level of Independence: Needs assistance         Comments: pt is a poor historian; however, stated that she does sit up in a w/c at home everyday. Unsure of how she transfers into it or the accuracy of her information provided.     Hand Dominance        Extremity/Trunk Assessment   Upper Extremity Assessment: Generalized weakness           Lower Extremity Assessment: Generalized weakness;RLE deficits/detail;LLE deficits/detail RLE Deficits / Details: bilateral LEs with knee and hip flexion contractures; knee can passively extend to approximately lacking 45 degrees to neutral. LLE Deficits / Details: bilateral LEs with knee and hip flexion contractures; knee can passively extend to lacking 45 degrees to neutral.  Cervical / Trunk Assessment: Kyphotic  Communication   Communication: No difficulties;Other (comment) (pt requires increased time for processing information)  Cognition Arousal/Alertness: Awake/alert Behavior During Therapy: WFL for tasks assessed/performed Overall Cognitive Status: History of cognitive impairments - at baseline  General Comments General comments (skin integrity, edema, etc.): pt with multiple weeping wounds all over her body. L UE edema and bilateral LE edema noted.    Exercises     Assessment/Plan    PT Assessment All further PT needs can be met in the next venue of care  PT Problem List Decreased strength;Decreased range of motion;Decreased activity tolerance;Decreased balance;Decreased mobility;Decreased coordination;Decreased cognition;Decreased safety awareness;Pain          PT  Treatment Interventions      PT Goals (Current goals can be found in the Care Plan section)  Acute Rehab PT Goals Patient Stated Goal: unable to state    Frequency     Barriers to discharge        Co-evaluation               End of Session   Activity Tolerance: Patient limited by pain;Patient limited by fatigue Patient left: in bed;with call bell/phone within reach;with bed alarm set Nurse Communication: Mobility status         Time: 8168-3870 PT Time Calculation (min) (ACUTE ONLY): 24 min   Charges:   PT Evaluation $PT Eval Moderate Complexity: 1 Procedure PT Treatments $Therapeutic Activity: 8-22 mins   PT G CodesClearnce Chapman Katherine Chapman 04/23/2016, 4:44 PM Sherie Don, [place], DPT [phone]

## 2016-04-23 NOTE — H&P (Signed)
History and Physical    Katherine Chapman ZOX:[id number] DOB: 07/21/1930 DOA: 04/22/2016  Referring MD/NP/PA: Dr. Myrtis SerKatz, resident PCP: No primary care provider on file.  Patient coming from: Home via EMS  Chief Complaint: Shortness of breath  HPI: Katherine Harenna Proano is a 82 y.o. female with medical history significant of HTN, CHF, CVA, and dementia nonverbal; who presents with progressively worsening shortness of breath over the last 4 days. History is from review of reports as family not available by phone at this time. Patient has dementia and is unable to provide her own history. Patient lives at home and is cared for by her son. Son notes trying to keep her changed. Associated symptoms include increased edema all over, bedsores, and poor appetite.  ED Course:  Patient initially was placed on CPAP by EMS after O2 saturations were noted to be in the 90s on room air. On admission to the emergency department patient patient was seen to be afebrile, pulse 86-119, respirations 13-24, blood pressure maintained, O2 saturations 90-100% with 3L of supplemental oxygen. Lab work revealed WBC 8.2, hemoglobin 10, platelets 203, sodium 133, potassium 5.3, chloride 108, CO2 18, BUN 36, creatinine 1.50, glucose 131, albumin 1.4, AST 69, BNP 1908.7, troponin 0.09. Chest x-Mondesir showing mild cardiomegaly with interstitial edema. Patient was given broad-spectrum antibiotics of Fortaz and vancomycin. She was given 40 mg of Lasix IV and Foley catheter was placed. [facility] called to admit.  Review of Systems: Unable to obtain a full history due to dementia.  Past Medical History:  Diagnosis Date  . CHF (congestive heart failure) (HCC)   . History of loop recorder   . Hypertension   . Stroke [facility](HCC)     Past Surgical History:  Procedure Laterality Date  . EP IMPLANTABLE DEVICE     loop recorder  . TEE WITHOUT CARDIOVERSION N/A 08/14/2014   Procedure: TRANSESOPHAGEAL ECHOCARDIOGRAM (TEE);  Surgeon: Lewayne BuntingBrian S Crenshaw, MD;  Location[facility]  ENDOSCOPY;  Service: Cardiovascular;  Laterality: N/A;     reports that she has been smoking.  She does not have any smokeless tobacco history on file. She reports that she does not drink alcohol or use drugs.  Allergies  Allergen Reactions  . Penicillins Hives    History reviewed. No pertinent family history.  Prior to Admission medications   Medication Sig Start Date End Date Taking? Authorizing Provider  allopurinol (ZYLOPRIM) 100 MG tablet Take 1 tablet (100 mg total) by mouth daily. 07/23/14  Yes Richarda OverlieNayana Abrol, MD  aspirin EC 81 MG tablet Take 81 mg by mouth daily.   Yes Historical Provider, MD  budesonide-formoterol (SYMBICORT) 160-4.5 MCG/ACT inhaler Inhale 2 puffs into the lungs 2 (two) times daily. Patient taking differently: Inhale 2 puffs into the lungs 2 (two) times daily as needed (shortness of breath).  07/23/14  Yes Richarda OverlieNayana Abrol, MD  carvedilol (COREG) 25 MG tablet Take 1 tablet (25 mg total) by mouth 2 (two) times daily with a meal. 07/23/14  Yes Richarda OverlieNayana Abrol, MD  furosemide (LASIX) 20 MG tablet Take 1 tablet (20 mg total) by mouth daily. Resume only after 4 days. 08/12/14  Yes Osvaldo ShipperGokul Krishnan, MD  hydrALAZINE (APRESOLINE) 50 MG tablet Take 1 tablet (50 mg total) by mouth 3 (three) times daily. 08/12/14  Yes Osvaldo ShipperGokul Krishnan, MD  isosorbide mononitrate (IMDUR) 60 MG 24 hr tablet Take 1 tablet (60 mg total) by mouth daily. 08/12/14  Yes Osvaldo ShipperGokul Krishnan, MD  polyvinyl alcohol (LIQUIFILM TEARS) 1.4 % ophthalmic solution Place 1 drop into  the right eye 3 (three) times daily as needed for dry eyes. 08/15/14  Yes Osvaldo ShipperGokul Krishnan, MD    Physical Exam:  Constitutional: Elderly female who appears significantly ill with poor overall care. Vitals:   04/22/16 2300 04/22/16 2315 04/22/16 2330 04/22/16 2340  BP: 134/76 121/78 114/81 133/76  Pulse:    98  Resp: 20 22 22 22   Temp:    98.7 F (37.1 C)  TempSrc:    Rectal  SpO2:    91%   Eyes: PERRL, lids and conjunctivae normal ENMT: Mucous  membranes are dry  Posterior pharynx clear of any exudate or lesions.  Neck: normal, supple, no masses, no thyromegaly.  JVD present Respiratory:  Tachypneic with decreased overall air movement and rales present. Cardiovascular: Tachycardic, + murmur / rubs / gallops. 2+ pitting lower  extremity edema. 2+ pedal pulses. No carotid bruits.  Abdomen: no tenderness, no masses palpated. No hepatosplenomegaly. Bowel sounds positive.  Musculoskeletal: Upper and lower extremity swelling with some signs of contractures of the upper and lower limbs. Skin: Multiple pressure ulcers comparing stages  2-4 noted and scattered skin breakdown Neurologic: Patient appears able to move all extremities.  Psychiatric: Lethargic, but arousable    Labs on Admission: I have personally reviewed following labs and imaging studies  CBC:  Recent Labs Lab 04/22/16 2115  WBC 8.2  NEUTROABS 7.3  HGB 10.0*  HCT 29.6*  MCV 71.5*  PLT 203   Basic Metabolic Panel:  Recent Labs Lab 04/22/16 2115  NA 133*  K 5.3*  CL 108  CO2 18*  GLUCOSE 131*  BUN 36*  CREATININE 1.50*  CALCIUM 7.8*   GFR: CrCl cannot be calculated (Unknown ideal weight.). Liver Function Tests:  Recent Labs Lab 04/22/16 2115  AST 69*  ALT 32  ALKPHOS 87  BILITOT 0.6  PROT 5.1*  ALBUMIN 1.4*   No results for input(s): LIPASE, AMYLASE in the last 168 hours. No results for input(s): AMMONIA in the last 168 hours. Coagulation Profile: No results for input(s): INR, PROTIME in the last 168 hours. Cardiac Enzymes: No results for input(s): CKTOTAL, CKMB, CKMBINDEX, TROPONINI in the last 168 hours. BNP (last 3 results) No results for input(s): PROBNP in the last 8760 hours. HbA1C: No results for input(s): HGBA1C in the last 72 hours. CBG: No results for input(s): GLUCAP in the last 168 hours. Lipid Profile: No results for input(s): CHOL, HDL, LDLCALC, TRIG, CHOLHDL, LDLDIRECT in the last 72 hours. Thyroid Function Tests: No  results for input(s): TSH, T4TOTAL, FREET4, T3FREE, THYROIDAB in the last 72 hours. Anemia Panel: No results for input(s): VITAMINB12, FOLATE, FERRITIN, TIBC, IRON, RETICCTPCT in the last 72 hours. Urine analysis:    Component Value Date/Time   COLORURINE YELLOW 04/01/2015 2013   APPEARANCEUR CLOUDY (A) 04/01/2015 2013   LABSPEC 1.007 04/01/2015 2013   PHURINE 7.0 04/01/2015 2013   GLUCOSEU NEGATIVE 04/01/2015 2013   HGBUR NEGATIVE 04/01/2015 2013   BILIRUBINUR NEGATIVE 04/01/2015 2013   KETONESUR NEGATIVE 04/01/2015 2013   PROTEINUR NEGATIVE 04/01/2015 2013   UROBILINOGEN 0.2 04/01/2015 2013   NITRITE NEGATIVE 04/01/2015 2013   LEUKOCYTESUR NEGATIVE 04/01/2015 2013   Sepsis Labs: No results found for this or any previous visit (from the past 240 hour(s)).   Radiological Exams on Admission: Dg Chest Port 1 View  Result Date: 04/22/2016 CLINICAL DATA:  Acute onset of shortness of breath. Initial encounter. EXAM: PORTABLE CHEST 1 VIEW COMPARISON:  Chest radiograph performed 08/11/2014 FINDINGS: The lungs are well-aerated. Small  bilateral pleural effusions are noted. Mild bibasilar opacities may reflect mild interstitial edema. No pneumothorax is seen. The cardiomediastinal silhouette is mildly enlarged. A metallic device is noted overlying the mediastinum. No acute osseous abnormalities are seen. IMPRESSION: Small bilateral pleural effusions. Mild bibasilar airspace opacities may reflect mild interstitial edema. Mild cardiomegaly. Electronically Signed   By: Roanna Raider M.D.   On: 04/22/2016 22:38    EKG: Independently reviewed. Sinus tachycardia  116 bpm with T-wave inversion in the anteriolateral leads.  Assessment/Plan CHF exacerbation: Acute. Patient presents with shortness of breath and swelling all over. Found to have elevated BNP of 1908.7 on admission Chest x-Najera showing mild bibasilar opacities that may reflect interstitial edema and cardiomegaly. Question if patient is also  third spacing due to hypoalbuminemia. - Admit to telemetry bed - Heart failure protocol initiated - Strict ins and outs - Lasix 40 mg IV q 12 hours as tolerated - Check echocardiogram in a.m. - Unclear if patient has been receiving medications of Coreg, hydralazine, isosorbide mononitrate will need to restart these when able.  - Check TSH  Acute respiratory failure with hypoxia: Patient with - Continuous pulse oximetry K oxygen keep O2 sats greater 92% - Budesonide and Brovana nebulizer treatments -  Duonebs prn SOB/Wheezing  SIRS/Sepsis/Lactic acidosis: Patient meeting SIRS criteria with tachycardia and tachypnea on admission. Lactic acid also seen elevated at 2.12. Patient has multiple bedsores as well as urine which could likely be a source of infection.  - Trend lactic acid levels - Follow-up urinalysis and blood culture - Continue empiric antibiotics of Fortaz and vancomycin   Hyperkalemia: Potassium 5.3 on admission. Patient given IV Lasix on the ED. - Will continue to monitor, and will replace when needed  Elevated troponin : Troponin initially 0.09. Suspected this can be secondary to demand ischemia.  -Trend cardiac department    Pressure ulcers: Patient has multiple pressure ulcers of varying stages present on her whole body. - Consult wound care - Low air loss mattress replacement   Chronic kidney disease stage III: Creatinine appears near baseline. - Recheck CMP in a.m.    Hypoalbuminemia - Check prealbumin in a.m.  Elevated AST:  AST elevated at 69 this could be secondary to passive congestion - Recheck CMP in a.m.  Essential hypertension - Continue to monitor -  add back medications as seen above when able  Dementia: Stable   DVT prophylaxis:  lovenox   Code Status:  Full Family Communication:  no family present at bedside and not available by phone Disposition Plan: To be determined Consults called: None  Admission status: Inpatient  Clydie Braun  MD [facility] Pager [phone]  If 7PM-7AM, please contact night-coverage [email or web address] Password [facility]  04/23/2016, 12:19 AM

## 2016-04-24 LAB — CALCIUM, IONIZED: CALCIUM, IONIZED, SERUM: 5.3 mg/dL (ref 4.5–5.6)

## 2016-04-24 MED ORDER — CARVEDILOL 3.125 MG PO TABS
3.1250 mg | ORAL_TABLET | Freq: Two times a day (BID) | ORAL | Status: DC
  Administered 2016-04-25 – 2016-05-06 (×23): 3.125 mg via ORAL
  Filled 2016-04-24 (×23): qty 1

## 2016-04-24 MED ORDER — CARVEDILOL 3.125 MG PO TABS
3.1250 mg | ORAL_TABLET | Freq: Two times a day (BID) | ORAL | Status: DC
  Administered 2016-04-24: 3.125 mg via ORAL
  Filled 2016-04-24: qty 1

## 2016-04-24 NOTE — Progress Notes (Addendum)
[facility] PROGRESS NOTE  Lurline Harenna Muldrew [id number] DOB: 05/05/1931 DOA: 04/22/2016   PCP: No primary care provider on file.     Assessment/Plan: Principal Problem:   CHF (congestive heart failure) (HCC) Active Problems:   Essential hypertension   Chronic kidney disease, stage III (moderate)   Dementia   Pressure injury of skin   Hyperkalemia   Elevated troponin   Hypoalbuminemia   Malnutrition of moderate degree   82 y.o.femalewith medical history significant of HTN, CHF, CVA, and dementia nonverbal; who presents with progressively worsening shortness of breath over the last 4 days. Patient found to be septic with gram-negative bacteremia, multiple wounds/bedsores.  Assessment/Plan Acute systolic heart failure. Patient presents with shortness of breath and swelling all over. Found to haveelevated BNP of 1908.7 on admission Chest x-Zia showingmild bibasilar opacities that may reflect interstitial edema and cardiomegaly.  Extremely poor nutritional status EF is significantly declined to 30-35% Patient also found to have severely increased pulmonary artery pressure of 79 mmHg  Continue telemetry bed - Strict ins and outs - Lasix 40 mg IV q 12 hours as tolerated, follow renal function closely TSH 4.2,   Restart Coreg Patient also found to have pericardial effusion that appears to be loculated At this time patient is extremely sick-consult to discuss goals of care. Palliative care consult is warranted and son is agreeable to meet with them    Acute respiratory failure with hypoxia:/Sepsis-likely source multiple wounds/UTI - Continuous pulse oximetry K oxygen keep O2 sats greater 92% - Budesonide and Brovana nebulizer treatments - Duonebs prn SOB/Wheezing  Sepsis with Proteus/ Lactic acidosis:  . Patient has multiple bedsores as well as urine which could likely be a source of infection.  Blood cultures positive for Proteus Antibiotics narrowed on  Rocephin   Hyperkalemia: Potassium 5.3 on admission. Resolved - Will continue to monitor, and will replace when needed  Elevated troponin : Troponin abnormal in the setting of sepsis/CHF. Suspected this can be secondary to demand ischemia.      Pressure ulcers: Patient has multiple pressure ulcers of varying stages present on her whole body. - Consult wound care - Lowair lossmattress replacement   Chronic kidney disease stage III: Creatinine appears near baseline. Continue to follow renal function  Hypoalbuminemia - Check prealbumin in a.m.  Elevated AST: AST elevated at 69 this could be secondary to passive congestion - Recheck CMP in a.m.  Essential hypertension - Continue to monitor - add back medications as seen above when able  Dementia: Overall prognosis grim in the setting of new onset CHF/severe pulmonary hypertension, multiple bedsores, protein calorie malnutrition     DVT prophylaxsis  Lovenox  Code Status:      Family Communication: Discussed in detail with the patient, all imaging results, lab results explained to the patient   Disposition Plan:  Palliative care consult for goals of care      Consultants:  Palliative care  Procedures:  None  Antibiotics: Anti-infectives    Start     Dose/Rate Route Frequency Ordered Stop   04/24/16 0600  cefTRIAXone (ROCEPHIN) 2 g in dextrose 5 % 50 mL IVPB     2 g 100 mL/hr over 30 Minutes Intravenous Every 24 hours 04/23/16 2346     04/23/16 2300  vancomycin (VANCOCIN) IVPB 1000 mg/200 mL premix     1,000 mg 200 mL/hr over 60 Minutes Intravenous Every 24 hours 04/22/16 2255     04/23/16 1100  cefTAZidime (FORTAZ) 1 g  in dextrose 5 % 50 mL IVPB  Status:  Discontinued     1 g 100 mL/hr over 30 Minutes Intravenous Every 12 hours 04/22/16 2255 04/23/16 2341   04/22/16 2330  cefTAZidime (FORTAZ) 2 g in dextrose 5 % 50 mL IVPB     2 g 100 mL/hr over 30 Minutes Intravenous  Once 04/22/16 2251  04/23/16 0025   04/22/16 2245  piperacillin-tazobactam (ZOSYN) IVPB 3.375 g  Status:  Discontinued     3.375 g 100 mL/hr over 30 Minutes Intravenous  Once 04/22/16 2239 04/22/16 2251   04/22/16 2245  vancomycin (VANCOCIN) IVPB 1000 mg/200 mL premix     1,000 mg 200 mL/hr over 60 Minutes Intravenous  Once 04/22/16 2239 04/23/16 0056         HPI/Subjective: nonverbal  Objective: Vitals:   04/23/16 1932 04/23/16 2135 04/24/16 0101 04/24/16 0400  BP:  (!) 132/95 134/86 126/76  Pulse:  (!) 120 (!) 116 (!) 108  Resp:  18 18 18   Temp:  98.1 F (36.7 C) 97.5 F (36.4 C) 98.7 F (37.1 C)  TempSrc:  Oral Oral Oral  SpO2: 99% 100% 99% 100%  Weight:    58.1 kg (128 lb)    Intake/Output Summary (Last 24 hours) at 04/24/16 0959 Last data filed at 04/24/16 0758  Gross per 24 hour  Intake             1547 ml  Output             2050 ml  Net             -503 ml    Exam:  Examination:  General exam: Appears calm and comfortable  Respiratory system: Clear to auscultation. Respiratory effort normal. Cardiovascular system: S1 & S2 heard, RRR. No JVD, murmurs, rubs, gallops or clicks. No pedal edema. Gastrointestinal system: Abdomen is nondistended, soft and nontender. No organomegaly or masses felt. Normal bowel sounds heard. Central nervous system:  . No focal neurological deficits. Extremities: Symmetric 5 x 5 power. Skin: No rashes, lesions or ulcers Psychiatry: Judgement and insight appear normal. Mood & affect appropriate.     Data Reviewed: I have personally reviewed following labs and imaging studies  Micro Results Recent Results (from the past 240 hour(s))  Urine culture     Status: Abnormal (Preliminary result)   Collection Time: 04/22/16 12:12 AM  Result Value Ref Range Status   Specimen Description URINE, CATHETERIZED  Final   Special Requests NONE  Final   Culture >=100,000 COLONIES/mL GRAM NEGATIVE RODS (A)  Final   Report Status PENDING  Incomplete  Culture,  blood (Routine x 2)     Status: Abnormal (Preliminary result)   Collection Time: 04/22/16 11:00 PM  Result Value Ref Range Status   Specimen Description BLOOD LEFT HAND  Final   Special Requests IN PEDIATRIC BOTTLE 4CC  Final   Culture  Setup Time   Final    GRAM NEGATIVE RODS AEROBIC BOTTLE ONLY CRITICAL RESULT CALLED TO, READ BACK BY AND VERIFIED WITH: J LEDFORD PHARMD 2310 04/23/16 A BROWNING    Culture PROTEUS MIRABILIS SUSCEPTIBILITIES TO FOLLOW  (A)  Final   Report Status PENDING  Incomplete  Blood Culture ID Panel (Reflexed)     Status: Abnormal   Collection Time: 04/22/16 11:00 PM  Result Value Ref Range Status   Enterococcus species NOT DETECTED NOT DETECTED Final   Listeria monocytogenes NOT DETECTED NOT DETECTED Final   Staphylococcus species NOT DETECTED NOT DETECTED  Final   Staphylococcus aureus NOT DETECTED NOT DETECTED Final   Streptococcus species NOT DETECTED NOT DETECTED Final   Streptococcus agalactiae NOT DETECTED NOT DETECTED Final   Streptococcus pneumoniae NOT DETECTED NOT DETECTED Final   Streptococcus pyogenes NOT DETECTED NOT DETECTED Final   Acinetobacter baumannii NOT DETECTED NOT DETECTED Final   Enterobacteriaceae species DETECTED (A) NOT DETECTED Final    Comment: CRITICAL RESULT CALLED TO, READ BACK BY AND VERIFIED WITH: J LEDFORD PHARMD 2310 04/23/16 A BROWNING    Enterobacter cloacae complex NOT DETECTED NOT DETECTED Final   Escherichia coli NOT DETECTED NOT DETECTED Final   Klebsiella oxytoca NOT DETECTED NOT DETECTED Final   Klebsiella pneumoniae NOT DETECTED NOT DETECTED Final   Proteus species DETECTED (A) NOT DETECTED Final    Comment: CRITICAL RESULT CALLED TO, READ BACK BY AND VERIFIED WITH: J LEDFORD PHARMD 2310 04/23/16 A BROWNING    Serratia marcescens NOT DETECTED NOT DETECTED Final   Carbapenem resistance NOT DETECTED NOT DETECTED Final   Haemophilus influenzae NOT DETECTED NOT DETECTED Final   Neisseria meningitidis NOT DETECTED  NOT DETECTED Final   Pseudomonas aeruginosa NOT DETECTED NOT DETECTED Final   Candida albicans NOT DETECTED NOT DETECTED Final   Candida glabrata NOT DETECTED NOT DETECTED Final   Candida krusei NOT DETECTED NOT DETECTED Final   Candida parapsilosis NOT DETECTED NOT DETECTED Final   Candida tropicalis NOT DETECTED NOT DETECTED Final    Radiology Reports Dg Chest Port 1 View  Result Date: 04/22/2016 CLINICAL DATA:  Acute onset of shortness of breath. Initial encounter. EXAM: PORTABLE CHEST 1 VIEW COMPARISON:  Chest radiograph performed 08/11/2014 FINDINGS: The lungs are well-aerated. Small bilateral pleural effusions are noted. Mild bibasilar opacities may reflect mild interstitial edema. No pneumothorax is seen. The cardiomediastinal silhouette is mildly enlarged. A metallic device is noted overlying the mediastinum. No acute osseous abnormalities are seen. IMPRESSION: Small bilateral pleural effusions. Mild bibasilar airspace opacities may reflect mild interstitial edema. Mild cardiomegaly. Electronically Signed   By: Roanna Raider M.D.   On: 04/22/2016 22:38     CBC  Recent Labs Lab 04/22/16 2115 04/23/16 0618  WBC 8.2 11.0*  HGB 10.0* 8.6*  HCT 29.6* 26.0*  PLT 203 134*  MCV 71.5* 71.0*  MCH 24.2* 23.5*  MCHC 33.8 33.1  RDW 13.8 13.7  LYMPHSABS 0.7 1.0  MONOABS 0.2 0.7  EOSABS 0.0 0.0  BASOSABS 0.0 0.0    Chemistries   Recent Labs Lab 04/22/16 2115 04/23/16 0618  NA 133* 136  K 5.3* 4.3  CL 108 109  CO2 18* 22  GLUCOSE 131* 93  BUN 36* 31*  CREATININE 1.50* 1.30*  CALCIUM 7.8* 8.1*  AST 69* 55*  ALT 32 29  ALKPHOS 87 66  BILITOT 0.6 0.2*   ------------------------------------------------------------------------------------------------------------------ CrCl cannot be calculated (Unknown ideal weight.). ------------------------------------------------------------------------------------------------------------------ No results for input(s): HGBA1C in the  last 72 hours. ------------------------------------------------------------------------------------------------------------------ No results for input(s): CHOL, HDL, LDLCALC, TRIG, CHOLHDL, LDLDIRECT in the last 72 hours. ------------------------------------------------------------------------------------------------------------------  Recent Labs  04/23/16 0143  TSH 4.260   ------------------------------------------------------------------------------------------------------------------ No results for input(s): VITAMINB12, FOLATE, FERRITIN, TIBC, IRON, RETICCTPCT in the last 72 hours.  Coagulation profile No results for input(s): INR, PROTIME in the last 168 hours.  No results for input(s): DDIMER in the last 72 hours.  Cardiac Enzymes  Recent Labs Lab 04/23/16 0143 04/23/16 0618 04/23/16 1436  TROPONINI 0.10* 0.08* 0.08*   ------------------------------------------------------------------------------------------------------------------ Invalid input(s): POCBNP   CBG: No results for input(s): GLUCAP in  the last 168 hours.     Studies: Dg Chest Port 1 View  Result Date: 04/22/2016 CLINICAL DATA:  Acute onset of shortness of breath. Initial encounter. EXAM: PORTABLE CHEST 1 VIEW COMPARISON:  Chest radiograph performed 08/11/2014 FINDINGS: The lungs are well-aerated. Small bilateral pleural effusions are noted. Mild bibasilar opacities may reflect mild interstitial edema. No pneumothorax is seen. The cardiomediastinal silhouette is mildly enlarged. A metallic device is noted overlying the mediastinum. No acute osseous abnormalities are seen. IMPRESSION: Small bilateral pleural effusions. Mild bibasilar airspace opacities may reflect mild interstitial edema. Mild cardiomegaly. Electronically Signed   By: Roanna Raider M.D.   On: 04/22/2016 22:38      Lab Results  Component Value Date   HGBA1C 6.0 (H) 07/21/2014   Lab Results  Component Value Date   CREATININE 1.30 (H)  04/23/2016       Scheduled Meds: . arformoterol  15 mcg Nebulization BID  . budesonide (PULMICORT) nebulizer solution  0.5 mg Nebulization BID  . cefTRIAXone (ROCEPHIN)  IV  2 g Intravenous Q24H  . collagenase   Topical Daily  . enoxaparin (LOVENOX) injection  30 mg Subcutaneous Q24H  . feeding supplement (ENSURE ENLIVE)  237 mL Oral BID BM  . feeding supplement (PRO-STAT SUGAR FREE 64)  30 mL Oral BID  . furosemide  40 mg Intravenous BID  . multivitamin with minerals  1 tablet Oral Daily  . nitroGLYCERIN  0.5 inch Topical Q6H  . sodium chloride flush  3 mL Intravenous Q12H  . vancomycin  1,000 mg Intravenous Q24H   Continuous Infusions:   LOS: 1 day    Time spent: >30 MINS    [facility]  [facility] Pager [phone]. If 7PM-7AM, please contact night-coverage at [email or web address], password [facility] 04/24/2016, 9:59 AM  LOS: 1 day

## 2016-04-24 NOTE — Progress Notes (Signed)
Per night shift, pts dressings were changed at 0650 today. Will re-dress/assess this evening.

## 2016-04-24 NOTE — Progress Notes (Signed)
Speech Language Pathology Treatment: Dysphagia  Patient Details Name: Katherine Chapman MRN: [id number] DOB: 08/05/1930 Today's Date: 04/24/2016 Time: [id number] SLP Time Calculation (min) (ACUTE ONLY): 25 min  Assessment / Plan / Recommendation Clinical Impression  Pt reports she doesn't wear her dentures at home when she eats.  She reports being hungry today...  SLP assisted pt to eat graham crackers, pudding, thin via straw. Overt cough s/p swallow of dry graham cracker noted, presumed due to premature spillage into pharynx.  Otherwise pt tolerated moistened cracker, pudding and water well.  Recommend continue diet with full assistance as pt can not feed herself.  Requested soft call bell for pt due to clenched hands preventing use of call bell.  Swallow precaution posted.     HPI HPI: 82 y.o. female with medical history significant of HTN, CHF, CVA, and dementia nonverbal; who presents with progressively worsening shortness of breath over the last 4 days.      SLP Plan  Continue with current plan of care     Recommendations  Diet recommendations: Dysphagia 3 (mechanical soft);Thin liquid Liquids provided via: Straw Medication Administration: Whole meds with puree Supervision: Staff to assist with self feeding Compensations: Slow rate;Small sips/bites Postural Changes and/or Swallow Maneuvers: Seated upright 90 degrees;Upright 30-60 min after meal                Oral Care Recommendations: Oral care BID Follow up Recommendations: None Plan: Continue with current plan of care       GO                Mills KollerKimball, Wendy Hoback Ann Sandi Towe, MS [facility] SLP [phone]

## 2016-04-24 NOTE — Progress Notes (Signed)
Pharmacy Antibiotic Note  Katherine Chapman is a 82 y.o. female admitted on 04/22/2016 with SOB, edema in lower extremities. Planning to start broad spectrum antibiotics for empiric coverage of possible urinary source/pneumonia/bed sores. Blood cx is now positive for proteus. Urine is positive for e.coli. Ok to [facility] v[facility] per Dr. Susie CassetteAbrol.    Plan:  Dc vanc Cont rocephin 2g IV q24    Temp (24hrs), Avg:98.1 F (36.7 C), Min:97.5 F (36.4 C), Max:98.7 F (37.1 C)   Recent Labs Lab 04/22/16 2115 04/22/16 2153 04/23/16 0006 04/23/16 0143 04/23/16 0326 04/23/16 0618  WBC 8.2  --   --   --   --  11.0*  CREATININE 1.50*  --   --   --   --  1.30*  LATICACIDVEN  --  2.12* 2.61* 2.5* 2.1*  --     CrCl cannot be calculated (Unknown ideal weight.).    Allergies  Allergen Reactions  . Penicillins Hives    Antimicrobials this admission: 11/21 vancomycin >11/23 11/21 ceftazidime >11/22 11/23 cetriaxone>>  Dose adjustments this admission: NA  Microbiology results: 11/21 blood cx: proteus 11/21 urine cx: Worthy Flanke.coli  Minh Pham, PharmD, BCPS, AAHIVP, CPP Infectious Disease Pharmacist Pager: [phone] 04/24/2016 1:23 PM

## 2016-04-25 DIAGNOSIS — Z515 Encounter for palliative care: Secondary | ICD-10-CM

## 2016-04-25 LAB — CULTURE, BLOOD (ROUTINE X 2)

## 2016-04-25 LAB — CBC
HCT: 22.5 % — ABNORMAL LOW (ref 36.0–46.0)
Hemoglobin: 7.5 g/dL — ABNORMAL LOW (ref 12.0–15.0)
MCH: 24 pg — ABNORMAL LOW (ref 26.0–34.0)
MCHC: 33.3 g/dL (ref 30.0–36.0)
MCV: 71.9 fL — ABNORMAL LOW (ref 78.0–100.0)
PLATELETS: 133 10*3/uL — AB (ref 150–400)
RBC: 3.13 MIL/uL — ABNORMAL LOW (ref 3.87–5.11)
RDW: 13.5 % (ref 11.5–15.5)
WBC: 5.8 10*3/uL (ref 4.0–10.5)

## 2016-04-25 LAB — COMPREHENSIVE METABOLIC PANEL
ALBUMIN: 1.3 g/dL — AB (ref 3.5–5.0)
ALT: 34 U/L (ref 14–54)
ANION GAP: 9 (ref 5–15)
AST: 69 U/L — AB (ref 15–41)
Alkaline Phosphatase: 70 U/L (ref 38–126)
BUN: 52 mg/dL — AB (ref 6–20)
CHLORIDE: 101 mmol/L (ref 101–111)
CO2: 26 mmol/L (ref 22–32)
Calcium: 7.7 mg/dL — ABNORMAL LOW (ref 8.9–10.3)
Creatinine, Ser: 1.76 mg/dL — ABNORMAL HIGH (ref 0.44–1.00)
GFR calc Af Amer: 30 mL/min — ABNORMAL LOW (ref >60)
GFR calc non Af Amer: 26 mL/min — ABNORMAL LOW (ref >60)
GLUCOSE: 79 mg/dL (ref 65–99)
POTASSIUM: 4 mmol/L (ref 3.5–5.1)
Sodium: 136 mmol/L (ref 135–145)
TOTAL PROTEIN: 4.4 g/dL — AB (ref 6.5–8.1)
Total Bilirubin: 0.3 mg/dL (ref 0.3–1.2)

## 2016-04-25 LAB — URINE CULTURE: Culture: >=100000 — AB

## 2016-04-25 MED ORDER — FUROSEMIDE 20 MG PO TABS
20.0000 mg | ORAL_TABLET | Freq: Every day | ORAL | Status: DC
  Administered 2016-04-26 – 2016-05-06 (×11): 20 mg via ORAL
  Filled 2016-04-25 (×11): qty 1

## 2016-04-25 NOTE — Progress Notes (Signed)
Pharmacist Heart Failure Core Measure Documentation  Assessment: Katherine Chapman has an EF documented as 30-35% on echo.  Rationale: Heart failure patients with left ventricular systolic dysfunction (LVSD) and an EF < 40% should be prescribed an angiotensin converting enzyme inhibitor (ACEI) or angiotensin receptor blocker (ARB) at discharge unless a contraindication is documented in the medical record.  This patient is not currently on an ACEI or ARB for HF.  This note is being placed in the record in order to provide documentation that a contraindication to the use of these agents is present for this encounter.  ACE Inhibitor or Angiotensin Receptor Blocker is contraindicated (specify all that apply)  []   ACEI allergy AND ARB allergy []   Angioedema []   Moderate or severe aortic stenosis []   Hyperkalemia []   Hypotension []   Renal artery stenosis [x]   Worsening renal function, preexisting renal disease or dysfunction   Severiano GilbertWilson, Frank Rhea 04/25/2016 1:56 PM

## 2016-04-25 NOTE — Progress Notes (Signed)
[facility] PROGRESS NOTE  Katherine Chapman ZOX:[id number] DOB: 15-Jul-1930 DOA: 04/22/2016   PCP: No primary care provider on file.     Assessment/Plan: Principal Problem:   CHF (congestive heart failure) (HCC) Active Problems:   Essential hypertension   Chronic kidney disease, stage III (moderate)   Dementia   Pressure injury of skin   Hyperkalemia   Elevated troponin   Hypoalbuminemia   Malnutrition of moderate degree   82 y.o.femalewith medical history significant of HTN, CHF, CVA, and dementia nonverbal; who presents with progressively worsening shortness of breath over the last 4 days. Patient found to be septic with gram-negative bacteremia, multiple wounds/bedsores.  Assessment/Plan Acute systolic heart failure. Patient presents with shortness of breath and swelling all over. Found to haveelevated BNP of 1908.7 on admission Chest x-Gramm showingmild bibasilar opacities that may reflect interstitial edema and cardiomegaly.  Extremely poor nutritional status EF is significantly declined to 30-35%, compared to last her Patient also found to have severely increased pulmonary artery pressure of 79 mmHg  Continue telemetry bed - Strict ins and outs Discontinue IV Lasix, weight has decreased from 133 pounds to 125 pounds, however creatinine is trending up. Start Lasix 20 mg daily tomorrow TSH 4.2,   Restart Coreg Patient also found to have pericardial effusion that appears to be loculated At this time patient is extremely sick-palliative care consult to discuss goals of care. Palliative care consult is warranted and son is agreeable to meet with them      Acute respiratory failure with hypoxia:/Sepsis-likely source multiple wounds/UTI - Continuous pulse oximetry K oxygen keep O2 sats greater 92% - Budesonide and Brovana nebulizer treatments - Duonebs prn SOB/Wheezing  Sepsis with Proteus/ Lactic acidosis:  . Patient has multiple bedsores as well as urine which  could likely be a source of infection.  Blood cx is now positive for proteus. Urine is positive for e.coli. Antibiotics narrowed on Rocephin   Hyperkalemia: Potassium 5.3 on admission. Resolved - Will continue to monitor, and will replace when needed  Elevated troponin : Troponin abnormal in the setting of sepsis/CHF. Suspected this can be secondary to demand ischemia.      Pressure ulcers: Patient has multiple pressure ulcers of varying stages present on her whole body. - Consult wound care - Lowair lossmattress replacement   Chronic kidney disease stage III: Creatinine appears near baseline. 1.5-1.7 Continue to follow renal function Hold Lasix for now  Hypoalbuminemia - Check prealbumin in a.m.  Elevated AST: AST elevated at 69 this could be secondary to passive congestion - Recheck CMP in a.m.  Essential hypertension - Continue to monitor - add back medications as seen above when able  Dementia: Overall prognosis grim in the setting of new onset CHF/severe pulmonary hypertension, multiple bedsores, protein calorie malnutrition. Patient's son is aware of patient's clinical condition. Updated him 11/23. He is awaiting palliative care meeting   Severe protein calorie malnutrition Albumin 1.3  DVT prophylaxsis  Lovenox  Code Status:      Family Communication: Discussed in detail with the patient, all imaging results, lab results explained to the patient   Disposition Plan:  Palliative care consult for goals of care      Consultants:  Palliative care  Procedures:  None  Antibiotics: Anti-infectives    Start     Dose/Rate Route Frequency Ordered Stop   04/24/16 0600  cefTRIAXone (ROCEPHIN) 2 g in dextrose 5 % 50 mL IVPB     2 g 100 mL/hr  over 30 Minutes Intravenous Every 24 hours 04/23/16 2346     04/23/16 2300  vancomycin (VANCOCIN) IVPB 1000 mg/200 mL premix  Status:  Discontinued     1,000 mg 200 mL/hr over 60 Minutes Intravenous Every 24  hours 04/22/16 2255 04/24/16 1321   04/23/16 1100  cefTAZidime (FORTAZ) 1 g in dextrose 5 % 50 mL IVPB  Status:  Discontinued     1 g 100 mL/hr over 30 Minutes Intravenous Every 12 hours 04/22/16 2255 04/23/16 2341   04/22/16 2330  cefTAZidime (FORTAZ) 2 g in dextrose 5 % 50 mL IVPB     2 g 100 mL/hr over 30 Minutes Intravenous  Once 04/22/16 2251 04/23/16 0025   04/22/16 2245  piperacillin-tazobactam (ZOSYN) IVPB 3.375 g  Status:  Discontinued     3.375 g 100 mL/hr over 30 Minutes Intravenous  Once 04/22/16 2239 04/22/16 2251   04/22/16 2245  vancomycin (VANCOCIN) IVPB 1000 mg/200 mL premix     1,000 mg 200 mL/hr over 60 Minutes Intravenous  Once 04/22/16 2239 04/23/16 0056         HPI/Subjective: Nonverbal,eating well per RN  Objective: Vitals:   04/24/16 0802 04/24/16 1323 04/24/16 2057 04/25/16 0453  BP: 132/70 125/61 107/70 (!) 142/86  Pulse: 96 99 (!) 104 (!) 102  Resp:  20 18 18   Temp:  97.8 F (36.6 C) 97.9 F (36.6 C) 98.5 F (36.9 C)  TempSrc:  Oral Oral Oral  SpO2:  100% 98% 99%  Weight:    56.7 kg (125 lb)    Intake/Output Summary (Last 24 hours) at 04/25/16 0933 Last data filed at 04/25/16 1610  Gross per 24 hour  Intake             1200 ml  Output             2700 ml  Net            -1500 ml    Exam:  Examination:  General exam: Appears calm and comfortable  Respiratory system: Clear to auscultation. Respiratory effort normal. Cardiovascular system: S1 & S2 heard, RRR. No JVD, murmurs, rubs, gallops or clicks. No pedal edema. Gastrointestinal system: Abdomen is nondistended, soft and nontender. No organomegaly or masses felt. Normal bowel sounds heard. Central nervous system:  . No focal neurological deficits. Extremities: Symmetric 5 x 5 power. Skin: No rashes, lesions or ulcers Psychiatry: Judgement and insight appear normal. Mood & affect appropriate.     Data Reviewed: I have personally reviewed following labs and imaging studies  Micro  Results Recent Results (from the past 240 hour(s))  Urine culture     Status: Abnormal   Collection Time: 04/22/16 12:12 AM  Result Value Ref Range Status   Specimen Description URINE, CATHETERIZED  Final   Special Requests NONE  Final   Culture >=100,000 COLONIES/mL ESCHERICHIA COLI (A)  Final   Report Status 04/25/2016 FINAL  Final   Organism ID, Bacteria ESCHERICHIA COLI (A)  Final      Susceptibility   Escherichia coli - MIC*    AMPICILLIN 8 SENSITIVE Sensitive     CEFAZOLIN <=4 SENSITIVE Sensitive     CEFTRIAXONE <=1 SENSITIVE Sensitive     CIPROFLOXACIN 0.5 SENSITIVE Sensitive     GENTAMICIN <=1 SENSITIVE Sensitive     IMIPENEM <=0.25 SENSITIVE Sensitive     NITROFURANTOIN <=16 SENSITIVE Sensitive     TRIMETH/SULFA <=20 SENSITIVE Sensitive     AMPICILLIN/SULBACTAM 4 SENSITIVE Sensitive     PIP/TAZO <=  4 SENSITIVE Sensitive     Extended ESBL NEGATIVE Sensitive     * >=100,000 COLONIES/mL ESCHERICHIA COLI  Culture, blood (Routine x 2)     Status: None (Preliminary result)   Collection Time: 04/22/16 10:52 PM  Result Value Ref Range Status   Specimen Description BLOOD LEFT ARM  Final   Special Requests BOTTLES DRAWN AEROBIC AND ANAEROBIC 5CC  Final   Culture NO GROWTH 1 DAY  Final   Report Status PENDING  Incomplete  Culture, blood (Routine x 2)     Status: Abnormal (Preliminary result)   Collection Time: 04/22/16 11:00 PM  Result Value Ref Range Status   Specimen Description BLOOD LEFT HAND  Final   Special Requests IN PEDIATRIC BOTTLE 4CC  Final   Culture  Setup Time   Final    GRAM NEGATIVE RODS AEROBIC BOTTLE ONLY CRITICAL RESULT CALLED TO, READ BACK BY AND VERIFIED WITH: J LEDFORD PHARMD 2310 04/23/16 A BROWNING    Culture PROTEUS MIRABILIS SUSCEPTIBILITIES TO FOLLOW  (A)  Final   Report Status PENDING  Incomplete  Blood Culture ID Panel (Reflexed)     Status: Abnormal   Collection Time: 04/22/16 11:00 PM  Result Value Ref Range Status   Enterococcus species NOT  DETECTED NOT DETECTED Final   Listeria monocytogenes NOT DETECTED NOT DETECTED Final   Staphylococcus species NOT DETECTED NOT DETECTED Final   Staphylococcus aureus NOT DETECTED NOT DETECTED Final   Streptococcus species NOT DETECTED NOT DETECTED Final   Streptococcus agalactiae NOT DETECTED NOT DETECTED Final   Streptococcus pneumoniae NOT DETECTED NOT DETECTED Final   Streptococcus pyogenes NOT DETECTED NOT DETECTED Final   Acinetobacter baumannii NOT DETECTED NOT DETECTED Final   Enterobacteriaceae species DETECTED (A) NOT DETECTED Final    Comment: CRITICAL RESULT CALLED TO, READ BACK BY AND VERIFIED WITH: J LEDFORD PHARMD 2310 04/23/16 A BROWNING    Enterobacter cloacae complex NOT DETECTED NOT DETECTED Final   Escherichia coli NOT DETECTED NOT DETECTED Final   Klebsiella oxytoca NOT DETECTED NOT DETECTED Final   Klebsiella pneumoniae NOT DETECTED NOT DETECTED Final   Proteus species DETECTED (A) NOT DETECTED Final    Comment: CRITICAL RESULT CALLED TO, READ BACK BY AND VERIFIED WITH: J LEDFORD PHARMD 2310 04/23/16 A BROWNING    Serratia marcescens NOT DETECTED NOT DETECTED Final   Carbapenem resistance NOT DETECTED NOT DETECTED Final   Haemophilus influenzae NOT DETECTED NOT DETECTED Final   Neisseria meningitidis NOT DETECTED NOT DETECTED Final   Pseudomonas aeruginosa NOT DETECTED NOT DETECTED Final   Candida albicans NOT DETECTED NOT DETECTED Final   Candida glabrata NOT DETECTED NOT DETECTED Final   Candida krusei NOT DETECTED NOT DETECTED Final   Candida parapsilosis NOT DETECTED NOT DETECTED Final   Candida tropicalis NOT DETECTED NOT DETECTED Final    Radiology Reports Dg Chest Port 1 View  Result Date: 04/22/2016 CLINICAL DATA:  Acute onset of shortness of breath. Initial encounter. EXAM: PORTABLE CHEST 1 VIEW COMPARISON:  Chest radiograph performed 08/11/2014 FINDINGS: The lungs are well-aerated. Small bilateral pleural effusions are noted. Mild bibasilar  opacities may reflect mild interstitial edema. No pneumothorax is seen. The cardiomediastinal silhouette is mildly enlarged. A metallic device is noted overlying the mediastinum. No acute osseous abnormalities are seen. IMPRESSION: Small bilateral pleural effusions. Mild bibasilar airspace opacities may reflect mild interstitial edema. Mild cardiomegaly. Electronically Signed   By: Roanna RaiderJeffery  Chang M.D.   On: 04/22/2016 22:38     CBC  Recent Labs Lab  04/22/16 2115 04/23/16 0618 04/25/16 0350  WBC 8.2 11.0* 5.8  HGB 10.0* 8.6* 7.5*  HCT 29.6* 26.0* 22.5*  PLT 203 134* 133*  MCV 71.5* 71.0* 71.9*  MCH 24.2* 23.5* 24.0*  MCHC 33.8 33.1 33.3  RDW 13.8 13.7 13.5  LYMPHSABS 0.7 1.0  --   MONOABS 0.2 0.7  --   EOSABS 0.0 0.0  --   BASOSABS 0.0 0.0  --     Chemistries   Recent Labs Lab 04/22/16 2115 04/23/16 0618 04/25/16 0350  NA 133* 136 136  K 5.3* 4.3 4.0  CL 108 109 101  CO2 18* 22 26  GLUCOSE 131* 93 79  BUN 36* 31* 52*  CREATININE 1.50* 1.30* 1.76*  CALCIUM 7.8* 8.1* 7.7*  AST 69* 55* 69*  ALT 32 29 34  ALKPHOS 87 66 70  BILITOT 0.6 0.2* 0.3   ------------------------------------------------------------------------------------------------------------------ CrCl cannot be calculated (Unknown ideal weight.). ------------------------------------------------------------------------------------------------------------------ No results for input(s): HGBA1C in the last 72 hours. ------------------------------------------------------------------------------------------------------------------ No results for input(s): CHOL, HDL, LDLCALC, TRIG, CHOLHDL, LDLDIRECT in the last 72 hours. ------------------------------------------------------------------------------------------------------------------  Recent Labs  04/23/16 0143  TSH 4.260   ------------------------------------------------------------------------------------------------------------------ No results for  input(s): VITAMINB12, FOLATE, FERRITIN, TIBC, IRON, RETICCTPCT in the last 72 hours.  Coagulation profile No results for input(s): INR, PROTIME in the last 168 hours.  No results for input(s): DDIMER in the last 72 hours.  Cardiac Enzymes  Recent Labs Lab 04/23/16 0143 04/23/16 0618 04/23/16 1436  TROPONINI 0.10* 0.08* 0.08*   ------------------------------------------------------------------------------------------------------------------ Invalid input(s): POCBNP   CBG: No results for input(s): GLUCAP in the last 168 hours.     Studies: No results found.    Lab Results  Component Value Date   HGBA1C 6.0 (H) 07/21/2014   Lab Results  Component Value Date   CREATININE 1.76 (H) 04/25/2016       Scheduled Meds: . arformoterol  15 mcg Nebulization BID  . budesonide (PULMICORT) nebulizer solution  0.5 mg Nebulization BID  . carvedilol  3.125 mg Oral BID WC  . cefTRIAXone (ROCEPHIN)  IV  2 g Intravenous Q24H  . collagenase   Topical Daily  . enoxaparin (LOVENOX) injection  30 mg Subcutaneous Q24H  . feeding supplement (ENSURE ENLIVE)  237 mL Oral BID BM  . feeding supplement (PRO-STAT SUGAR FREE 64)  30 mL Oral BID  . furosemide  40 mg Intravenous BID  . multivitamin with minerals  1 tablet Oral Daily  . nitroGLYCERIN  0.5 inch Topical Q6H  . sodium chloride flush  3 mL Intravenous Q12H   Continuous Infusions:   LOS: 2 days    Time spent: >30 MINS    Defiance Regional Medical CenterBROL,Jamarie [place]  [facility] Pager [phone]. If 7PM-7AM, please contact night-coverage at [email or web address], password [email or web address] 04/25/2016, 9:33 AM  LOS: 2 days

## 2016-04-25 NOTE — Progress Notes (Signed)
Called [facility] at [phone] and they are closed today. Spoke to SW handling 3E for advice on palliative care consult received on active APS case involving Katherine Chapman. Ms Horstman per chart review has been living with her son in a home with no running water, pt has multiple bedsores, left alone for periods of time; advanced dementia. Updated Dr. Susie CassetteAbrol. Placed consult to SW to assist in guardianship to ascertain who has the ability to make decision for this pt in lieu of this information. Per SW Maralyn SagoSarah Boswell's note 04/23/16 pt has a sister in [place]. Will assess the pt but will likely have to wait until 04/28/16 to perform GOC when APS is open. Dr. Susie CassetteAbrol aware and not planning DC at this point. Eduard RouxSarah Xaviera Flaten, ANP

## 2016-04-26 LAB — ABO/RH: ABO/RH(D): O POS
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
## 2016-04-27 LAB — CBC
HCT: 21.3 % — ABNORMAL LOW (ref 36.0–46.0)
Hemoglobin: 7 g/dL — ABNORMAL LOW (ref 12.0–15.0)
MCH: 24.1 pg — AB (ref 26.0–34.0)
MCHC: 32.9 g/dL (ref 30.0–36.0)
MCV: 73.2 fL — AB (ref 78.0–100.0)
PLATELETS: 188 10*3/uL (ref 150–400)
RBC: 2.91 MIL/uL — AB (ref 3.87–5.11)
RDW: 13.4 % (ref 11.5–15.5)
WBC: 5.5 10*3/uL (ref 4.0–10.5)

## 2016-04-27 LAB — PREPARE RBC (CROSSMATCH)

## 2016-04-27 LAB — COMPREHENSIVE METABOLIC PANEL
ALK PHOS: 68 U/L (ref 38–126)
ALT: 36 U/L (ref 14–54)
AST: 63 U/L — AB (ref 15–41)
Albumin: 1.3 g/dL — ABNORMAL LOW (ref 3.5–5.0)
Anion gap: 7 (ref 5–15)
BUN: 55 mg/dL — AB (ref 6–20)
CALCIUM: 7.8 mg/dL — AB (ref 8.9–10.3)
CHLORIDE: 102 mmol/L (ref 101–111)
CO2: 30 mmol/L (ref 22–32)
CREATININE: 1.54 mg/dL — AB (ref 0.44–1.00)
GFR, EST AFRICAN AMERICAN: 35 mL/min — AB (ref >60)
GFR, EST NON AFRICAN AMERICAN: 30 mL/min — AB (ref >60)
Glucose, Bld: 88 mg/dL (ref 65–99)
Potassium: 4.1 mmol/L (ref 3.5–5.1)
Sodium: 139 mmol/L (ref 135–145)
Total Bilirubin: 0.2 mg/dL — ABNORMAL LOW (ref 0.3–1.2)
Total Protein: 4.8 g/dL — ABNORMAL LOW (ref 6.5–8.1)

## 2016-04-27 MED ORDER — SODIUM CHLORIDE 0.9 % IV SOLN
Freq: Once | INTRAVENOUS | Status: AC
  Administered 2016-04-27: 1 mL via INTRAVENOUS
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
## 2016-04-28 DIAGNOSIS — F0150 Vascular dementia without behavioral disturbance: Secondary | ICD-10-CM

## 2016-04-28 LAB — CBC
HCT: 28 % — ABNORMAL LOW (ref 36.0–46.0)
Hemoglobin: 9 g/dL — ABNORMAL LOW (ref 12.0–15.0)
MCH: 24.7 pg — AB (ref 26.0–34.0)
MCHC: 32.1 g/dL (ref 30.0–36.0)
MCV: 76.7 fL — AB (ref 78.0–100.0)
PLATELETS: 188 10*3/uL (ref 150–400)
RBC: 3.65 MIL/uL — AB (ref 3.87–5.11)
RDW: 14.6 % (ref 11.5–15.5)
WBC: 6.4 10*3/uL (ref 4.0–10.5)

## 2016-04-28 LAB — CULTURE, BLOOD (ROUTINE X 2): CULTURE: NO GROWTH

## 2016-04-28 LAB — TYPE AND SCREEN
ABO/RH(D): O POS
ANTIBODY SCREEN: NEGATIVE
UNIT DIVISION: 0

## 2016-04-28 LAB — COMPREHENSIVE METABOLIC PANEL
ALT: 41 U/L (ref 14–54)
ANION GAP: 9 (ref 5–15)
AST: 70 U/L — ABNORMAL HIGH (ref 15–41)
Albumin: 1.4 g/dL — ABNORMAL LOW (ref 3.5–5.0)
Alkaline Phosphatase: 69 U/L (ref 38–126)
BUN: 57 mg/dL — ABNORMAL HIGH (ref 6–20)
CHLORIDE: 103 mmol/L (ref 101–111)
CO2: 29 mmol/L (ref 22–32)
Calcium: 7.8 mg/dL — ABNORMAL LOW (ref 8.9–10.3)
Creatinine, Ser: 1.3 mg/dL — ABNORMAL HIGH (ref 0.44–1.00)
GFR, EST AFRICAN AMERICAN: 43 mL/min — AB (ref >60)
GFR, EST NON AFRICAN AMERICAN: 37 mL/min — AB (ref >60)
Glucose, Bld: 86 mg/dL (ref 65–99)
POTASSIUM: 3.8 mmol/L (ref 3.5–5.1)
SODIUM: 141 mmol/L (ref 135–145)
Total Bilirubin: 0.2 mg/dL — ABNORMAL LOW (ref 0.3–1.2)
Total Protein: 5 g/dL — ABNORMAL LOW (ref 6.5–8.1)

## 2016-04-28 NOTE — Progress Notes (Signed)
Nutrition Follow-up  DOCUMENTATION CODES:   Non-severe (moderate) malnutrition in context of chronic illness, Underweight  INTERVENTION:  Calorie Count initiated; f/up with results on 11/28 Continue Ensure Enlive po BID, each supplement provides 350 kcal and 20 grams of protein Continue 30 ml Pro-stat BID, each dose provides 100 kcal and 15 grams protein   NUTRITION DIAGNOSIS:   Increased nutrient needs related to wound healing as evidenced by estimated needs.  Ongoing  GOAL:   Patient will meet greater than or equal to 90% of their needs  Likely being Met  MONITOR:   PO intake, Supplement acceptance, Labs, Weight trends, Skin, I & O's  REASON FOR ASSESSMENT:   Consult Calorie Count  ASSESSMENT:   82 y.o. female with medical history significant of HTN, CHF, CVA, and dementia nonverbal; who presents with progressively worsening shortness of breath over the last 4 days. Associated symptoms include increased edema all over, bedsores, and poor appetite.  RD consulted for assessment and calorie count. Per nursing notes, pt is eating 50-75% of most meals, though some meals are not documented. Supplements are documented as being given consistently.  Pt asleep at time of visit; curled up position as previous visit. RD discussed calorie count plans with RN. Pt's weight has gone down 19 lbs since admission.   Labs: low calcium, elevated AST, elevated BUN, low albumin, low hemoglobin  Diet Order:  DIET DYS 3 Room service appropriate? Yes; Fluid consistency: Thin  Skin:  Wound (see comment) (Stage II PI on L arm & L buttocks,Stage III PI on R buttocks)  Last BM:  11/23  Height:   Ht Readings from Last 1 Encounters:  04/01/15 5' 7" (1.702 m)    Weight:   Wt Readings from Last 1 Encounters:  04/28/16 114 lb (51.7 kg)    Ideal Body Weight:     BMI:  Body mass index is 17.85 kg/m.  Estimated Nutritional Needs:   Kcal:  1500-1700  Protein:  85-95 grams  Fluid:   1.7 L/day  EDUCATION NEEDS:   No education needs identified at this time  Scarlette Ar RD, CSP, LDN Inpatient Clinical Dietitian Pager: [phone] After Hours Pager: [phone]

## 2016-04-28 NOTE — Clinical Social Work Note (Addendum)
Per Interior and spatial designerdirector of social work, CSW will need to contact DSS worker to find out who to discuss discharge planning with. CSW called and left voicemail for Lebron ConnersLanae Anderson [phone]). CSW returned voicemail to Murrell ConverseSarah Dunn, NP for palliative care. CSW left voicemail with contact information.  Charlynn CourtSarah Cyenna Rebello, CSW [phone]  1:38 pm CSW discussed current plan regarding palliative with Murrell ConverseSarah Dunn, NP with palliative care. CSW awaiting palliative recommendations before initiating SNF placement with patient's children, Loraine LericheMark and MilburnMary. Per MD, overall prognosis is "grim."  Charlynn CourtSarah Kuron Docken, CSW [phone]

## 2016-04-28 NOTE — Care Management Important Message (Signed)
Important Message  Patient Details  Name: Katherine Chapman MRN: [id number] Date of Birth: 02/04/1931   Medicare Important Message Given:  Yes    [name] 04/28/2016, 11:35 AM

## 2016-04-28 NOTE — Progress Notes (Signed)
Dressing changes completed per order. Patient tolerated procedure well. Will continue to monitor.

## 2016-04-28 NOTE — Progress Notes (Signed)
Palliative Medicine RN Note: rec'd call from Lebron ConnersLanae Anderson w DSS; she has spoken w NP. Her cell is [phone].  Margret ChanceMelanie G. Dawud Mays, RN, BSN, [facility] 04/28/2016 12:35 PM Cell [phone] 8:00-4:00 Monday-Friday Office [phone]

## 2016-04-28 NOTE — Progress Notes (Addendum)
Palliative Medicine RN Note: Follow up to screening done by PMT NP S Bullard on 11/24. SW notes on Wednesday 11/22 (day before Thanksgiving) indicate that DSS is waiting on medical records. PMT will continue to work on communicating with DSS case worker to find out who is point of contact for Ms. Vasconez. Full PMT consult will be done at that time.  Margret ChanceMelanie G. Marlee Armenteros, RN, BSN, [facility] 04/28/2016 9:05 AM Cell [phone] 8:00-4:00 Monday-Friday Office [phone]   [phone] Update: Left message at DSS for CM Lanae Anderson. Requested call back to let us know who we are allowed to speak with and left my cell phone number. Will f/u later today if no call back. As it is early in the morning of the first day following a long weekend, I expect a delay in return call.  Margret ChanceMelanie G. Hartley Urton, RN, BSN, [facility] 04/28/2016 9:48 AM Cell [phone] 8:00-4:00 Monday-Friday Office [phone]
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
## 2016-04-28 NOTE — Progress Notes (Signed)
Order for Foley continued.  Diarra Ceja, RN
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
## 2016-04-28 NOTE — Progress Notes (Signed)
Speech Language Pathology Treatment: Dysphagia  Patient Details Name: Katherine Chapman MRN: [id number] DOB: 08/11/1930 Today's Date: 04/28/2016 Time: [id number] SLP Time Calculation (min) (ACUTE ONLY): 19 min  Assessment / Plan / Recommendation Clinical Impression  Pt was partially reclined upon SLP arrival for PO trials and NT was administering breakfast tray. SLP took over and delayed coughing following solid and thin liquids was observed. SLP repositioned pt to be more upright. Given additional thin liquids, pureed and chopped solid trials no further coughing was seen. Mild oral residue was cleared by alternating between solids and thin liquids. Recommend continuation of current diet (Dys 3 and thin liquids). Will continue to follow for diet tolerance.   HPI HPI: 82 y.o. female with medical history significant of HTN, CHF, CVA, and dementia nonverbal; who presents with progressively worsening shortness of breath over the last 4 days.      SLP Plan  Continue with current plan of care     Recommendations  Diet recommendations: Thin liquid;Dysphagia 3 (mechanical soft) Liquids provided via: Straw Medication Administration: Whole meds with puree Supervision: Staff to assist with self feeding Compensations: Slow rate;Small sips/bites;Minimize environmental distractions;Follow solids with liquid Postural Changes and/or Swallow Maneuvers: Seated upright 90 degrees;Upright 30-60 min after meal                Oral Care Recommendations: Oral care BID Follow up Recommendations: 24 hour supervision/assistance Plan: Continue with current plan of care       GO               Tollie EthHaleigh Ragan Gillis Boardley, Student SLP  Caryl NeverHaleigh R Markan Cazarez 04/28/2016, 2:27 PM

## 2016-04-29 DIAGNOSIS — E440 Moderate protein-calorie malnutrition: Secondary | ICD-10-CM

## 2016-04-29 DIAGNOSIS — Z7189 Other specified counseling: Secondary | ICD-10-CM

## 2016-04-29 DIAGNOSIS — I5022 Chronic systolic (congestive) heart failure: Secondary | ICD-10-CM
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
## 2016-04-29 NOTE — [place] FL2 (Signed)
[place] MEDICAID FL2 LEVEL OF CARE SCREENING TOOL     IDENTIFICATION  Patient Name: Katherine Chapman Birthdate: 09/16/1930 Sex: female Admission Date (Current Location): 04/22/2016  [facility] [place]   Facility and Address:  [facility] [place], [address], [place], [place] [address]      Provider Number: [id number]  Attending Physician Name and Address:  Richarda OverlieNayana Abrol, MD  Relative Name and Phone Number:       Current Level of Care: Hospital Recommended Level of Care: Skilled Nursing Facility Prior Approval Number:    Date Approved/Denied:   PASRR Number: [id number] [id number]  Discharge Plan: SNF    Current Diagnoses: Patient Active Problem List   Diagnosis Date Noted  . Palliative care encounter   . CHF (congestive heart failure) (HCC) 04/23/2016  . Pressure injury of skin 04/23/2016  . Hyperkalemia 04/23/2016  . Elevated troponin 04/23/2016  . Hypoalbuminemia 04/23/2016  . Malnutrition of moderate degree 04/23/2016  . Essential hypertension 08/12/2014  . Chronic kidney disease, stage III (moderate) 08/12/2014  . Acute renal failure (HCC) 08/12/2014  . Dementia 08/12/2014  . History of stroke 08/12/2014  . Hypokalemia 08/12/2014  . Syncope, brief 08/11/2014  . History of loop recorder 08/11/2014  . Cellulitis 07/21/2014    Orientation RESPIRATION BLADDER Height & Weight     Self  O2 (Nasal canula 2 L) Incontinent, Indwelling catheter Weight: 115 lb (52.2 kg) Height:     BEHAVIORAL SYMPTOMS/MOOD NEUROLOGICAL BOWEL NUTRITION STATUS   (None)  (Dementia) Incontinent Diet (DYS 3: Heart healthy, ground meat, extra gravy/sauce)  AMBULATORY STATUS COMMUNICATION OF NEEDS Skin   Total Care Verbally Other (Comment) (Blister left leg. Pressure Injuries: Stage 2 left arm (ABD, gauze daily), Stage 3 right buttocks (Abdominal binder, gauze daily), Stage 2 left buttocks (Abdominal binder, gauze daily). Venous Stasis Ulcer posterior leg (Foam  daily).)                       Personal Care Assistance Level of Assistance  Bathing, Feeding, Dressing Bathing Assistance: Maximum assistance Feeding assistance: Maximum assistance Dressing Assistance: Maximum assistance     Functional Limitations Info  Sight, Hearing, Speech Sight Info: Adequate Hearing Info: Adequate Speech Info: Adequate    SPECIAL CARE FACTORS FREQUENCY  PT (By licensed PT), Blood pressure, Speech therapy     PT Frequency: 5 x week       Speech Therapy Frequency: 5 x week      Contractures Contractures Info: Not present    Additional Factors Info  Code Status, Allergies Code Status Info: Full Allergies Info: Penicillins           Current Medications (04/29/2016):  This is the current hospital active medication list Current Facility-Administered Medications  Medication Dose Route Frequency Provider Last Rate Last Dose  . 0.9 %  sodium chloride infusion  250 mL Intravenous PRN Clydie Braunondell A Smith, MD 10 mL/hr at 04/26/16 0523 250 mL at 04/26/16 0523  . acetaminophen (TYLENOL) tablet 650 mg  650 mg Oral Q4H PRN Clydie Braunondell A Smith, MD   650 mg at 04/29/16 0527  . arformoterol (BROVANA) nebulizer solution 15 mcg  15 mcg Nebulization BID Clydie Braunondell A Smith, MD   15 mcg at 04/29/16 0854  . budesonide (PULMICORT) nebulizer solution 0.5 mg  0.5 mg Nebulization BID Clydie Braunondell A Smith, MD   0.5 mg at 04/29/16 0854  . carvedilol (COREG) tablet 3.125 mg  3.125 mg Oral BID WC Nayana Abrol,  MD   3.125 mg at 04/28/16 1630  . cefTRIAXone (ROCEPHIN) 2 g in dextrose 5 % 50 mL IVPB  2 g Intravenous Q24H Stevphen RochesterJames L Ledford, RPH   2 g at 04/29/16 0527  . collagenase (SANTYL) ointment   Topical Daily Richarda OverlieNayana Abrol, MD   1 application at 04/28/16 1000  . enoxaparin (LOVENOX) injection 30 mg  30 mg Subcutaneous Q24H Rondell A Katrinka BlazingSmith, MD   30 mg at 04/28/16 1020  . feeding supplement (ENSURE ENLIVE) (ENSURE ENLIVE) liquid 237 mL  237 mL Oral BID BM Richarda OverlieNayana Abrol, MD   237 mL at  04/28/16 1500  . feeding supplement (PRO-STAT SUGAR FREE 64) liquid 30 mL  30 mL Oral BID Richarda OverlieNayana Abrol, MD   30 mL at 04/28/16 2353  . furosemide (LASIX) tablet 20 mg  20 mg Oral Daily Richarda OverlieNayana Abrol, MD   20 mg at 04/28/16 1020  . ipratropium-albuterol (DUONEB) 0.5-2.5 (3) MG/3ML nebulizer solution 3 mL  3 mL Nebulization Q2H PRN Clydie Braunondell A Smith, MD      . multivitamin with minerals tablet 1 tablet  1 tablet Oral Daily Richarda OverlieNayana Abrol, MD   1 tablet at 04/28/16 1020  . nitroGLYCERIN (NITROGLYN) 2 % ointment 0.5 inch  0.5 inch Topical Q6H Cherlynn PerchesEric Katz, MD   0.5 inch at 04/29/16 0527  . ondansetron (ZOFRAN) injection 4 mg  4 mg Intravenous Q6H PRN Rondell A Katrinka BlazingSmith, MD      . sodium chloride flush (NS) 0.9 % injection 3 mL  3 mL Intravenous Q12H Clydie Braunondell A Smith, MD   3 mL at 04/28/16 2354  . sodium chloride flush (NS) 0.9 % injection 3 mL  3 mL Intravenous PRN Clydie Braunondell A Smith, MD         Discharge Medications: Please see discharge summary for a list of discharge medications.  Relevant Imaging Results:  Relevant Lab Results:   Additional Information SS#: [id number]. Open APS investigation. Patient was living with son but he told palliative that [place] to [place]. Two sisters and daughter live in [place]. Timothy LassoZack will sign her in if no family can do it.  Margarito LinerSarah C Sherah Lund, LCSW

## 2016-04-29 NOTE — Consult Note (Signed)
Consultation Note Date: 04/29/2016   Patient Name: Katherine Chapman  DOB: Feb 17, 1931  MRN: [id number]  Age / Sex: 82 y.o., female  PCP: No primary care provider on file. Referring Physician: Richarda Overlie, MD  Reason for Consultation: Disposition, Establishing goals of care, Interfamily conflict and Psychosocial/spiritual support  HPI/Patient Profile: 82 y.o. female  with past medical history of CHF, CKD, HTN, Dementia, and CVA who was  admitted on 04/22/2016 with progressive shortness of breath. Per family, pt had relocated to [facility] in February 2016 to live with her son. The last time her daughter and two sisters saw her she was verbal, ambulatory, and preforming most of her ADLs with minimal assistance. This admission, she presented bed-bound, non-verbal, Albumin 1.4, and wtih multiple bedsores. Diagnostically, she had an acute exacerbation of her known CHF, Pulmonary HTN, and Sepsis (either from UTI or sores).  Clinical Assessment and Goals of Care: Katherine Chapman has two children and two sisters who have been intermittently involved with her care. She had previously been living with her daughter, Katherine Chapman, in [place], then moved to [facility] to stay with her son, Katherine Chapman, in February 2016. In this time she had a marked deterioration. I spoke at length with her son yesterday and we planned to have a conference call today, along with his sister, to discuss their mother's current health state, and make plans for discharge. Despite a planned time to connect via phone, Katherine Chapman did not call-in, nor did he return two of my calls (messages left on his voicemail). I was able to connect with Katherine Chapman and Katherine Chapman (pt's sister).   Neither Katherine Chapman nor Katherine Chapman realized how much of a decline Katherine Chapman experienced this past year. They were very surprised and upset when I relayed her physical/behavioral appearance. Katherine Chapman, specifically, had no idea of her health issues or how serious  her condition was this hospitalization. Through our conversation they both expressed the need to focus on improving her quality of life, and agreed that a SNF would provide the best care. They asked that the SNF be as close to them as possible, as they want to take her home  After her stay there (to live with [facility], with support from [place]). I then brought-up code status. Katherine Chapman asked to think on this today and discuss it with her aunts, and asked that I call back tomorrow around the same time for her decision. In the meantime, they wish to continue treating what is treatable.  Primary Decision Maker NEXT OF KIN; pt has two children Katherine Chapman and Katherine Chapman).    SUMMARY OF RECOMMENDATIONS    Full code for now, will re-address with [facility] tomorrow  Full scope treatment; treat what is treatable with goal of stabilizing and getting to SNF with Palliative near family  I've left two messages with Katherine Chapman with my contact information. I am still waiting to hear back from him  Code Status/Advance Care Planning:  Full code  Palliative Prophylaxis:   Oral Care, Palliative Wound Care and Turn Reposition  Additional Recommendations (  Limitations, Scope, Preferences):  Full Scope Treatment  Psycho-social/Spiritual:   Desire for further Chaplaincy support:no  Prognosis:   Unable to determine; pt has serious acute medical issues with overall FTT and deterioration. However, her oral intake has improved and her acute medical issues are being well managed and improving.   Discharge Planning: Skilled Nursing Facility for rehab with Palliative care service follow-up      Primary Diagnoses: Present on Admission: . Pressure injury of skin . Hyperkalemia . Elevated troponin . Hypoalbuminemia . Chronic kidney disease, stage III (moderate) . Dementia . Essential hypertension  I have reviewed the medical record, interviewed the patient and family, and examined the patient. The following aspects are  pertinent.  Past Medical History:  Diagnosis Date  . CHF (congestive heart failure) (HCC)   . History of loop recorder   . Hypertension   . Stroke [facility](HCC)    Social History   Social History  . Marital status: Widowed    Spouse name: N/A  . Number of children: N/A  . Years of education: N/A   Social History Main Topics  . Smoking status: Current Some Day Smoker  . Smokeless tobacco: None  . Alcohol use No  . Drug use: No  . Sexual activity: No   Other Topics Concern  . None   Social History Narrative  . None   History reviewed. No pertinent family history. Scheduled Meds: . arformoterol  15 mcg Nebulization BID  . budesonide (PULMICORT) nebulizer solution  0.5 mg Nebulization BID  . carvedilol  3.125 mg Oral BID WC  . cefTRIAXone (ROCEPHIN)  IV  2 g Intravenous Q24H  . collagenase   Topical Daily  . enoxaparin (LOVENOX) injection  30 mg Subcutaneous Q24H  . feeding supplement (ENSURE ENLIVE)  237 mL Oral BID BM  . feeding supplement (PRO-STAT SUGAR FREE 64)  30 mL Oral BID  . furosemide  20 mg Oral Daily  . multivitamin with minerals  1 tablet Oral Daily  . nitroGLYCERIN  0.5 inch Topical Q6H  . sodium chloride flush  3 mL Intravenous Q12H   Continuous Infusions: PRN Meds:.sodium chloride, acetaminophen, ipratropium-albuterol, ondansetron (ZOFRAN) IV, sodium chloride flush Allergies  Allergen Reactions  . Penicillins Hives   Review of Systems  Pt slightly more verbally interactive today. She was able to consistently answer simple questions: She denies pain/discomfort.  She slept well overnight.  She is not presently hungry or thirsty. Nothing is bothering her at the moment, "I just like to rest"  Physical Exam  Constitutional: She appears lethargic. She has a sickly appearance.  Frail and thin lady curled up in bed   HENT:  Mouth/Throat: Mucous membranes are normal.  Eyes:  Vision loss in right eye, diminished vision in left eye  Cardiovascular: Normal  rate.   Pulmonary/Chest: Effort normal and breath sounds normal. No accessory muscle usage. No tachypnea.  Abdominal: Soft. Bowel sounds are normal.  Musculoskeletal:  Contractures noted. Increased movement on left side of body. Generalized weakness, and unable to reposition self  Neurological: She appears lethargic.  Skin: Skin is warm and dry.  Multiple pressure ulcers noted with dressing CDI  Psychiatric: Her speech is delayed. She is slowed and withdrawn.  Calm and lethargic. Minimal verbal interaction, unclear on cognition. Known dementia.    Vital Signs: BP (!) 173/82 (BP Location: Left Arm)   Pulse 77   Temp 98.2 F (36.8 C) (Oral)   Resp 18   Wt 52.2 kg (115 lb)  SpO2 100%   BMI 18.01 kg/m  Pain Assessment: Faces   Pain Score: Asleep  SpO2: SpO2: 100 % O2 Device:SpO2: 100 % O2 Flow Rate: .O2 Flow Rate (L/min): 2 L/min  IO: Intake/output summary:   Intake/Output Summary (Last 24 hours) at 04/29/16 1338 Last data filed at 04/29/16 0844  Gross per 24 hour  Intake              323 ml  Output             1400 ml  Net            -1077 ml    LBM: Last BM Date: 04/28/16 Baseline Weight: Weight: 60.3 kg (133 lb) (bed) Most recent weight: Weight: 52.2 kg (115 lb)     Palliative Assessment/Data: PPS 10%   Flowsheet Rows   Flowsheet Row Most Recent Value  Intake Tab  Referral Department  Hospitalist  Unit at Time of Referral  Cardiac/Telemetry Unit  Palliative Care Primary Diagnosis  Cardiac  Date Notified  04/24/16  Palliative Care Type  New Palliative care  Reason for referral  Clarify Goals of Care  Date of Admission  04/22/16  # of days IP prior to Palliative referral  2  Clinical Assessment  Psychosocial & Spiritual Assessment  Palliative Care Outcomes      Time In: 1210 Time Out: 1300 Time Total: 50 minutes Greater than 50%  of this time was spent counseling and coordinating care related to the above assessment and plan.  Signed by: Murrell ConverseSarah Brooklyn Alfredo,  NP Palliative Medicine Team Team Phone # [phone] (Nights/Weekends)

## 2016-04-29 NOTE — Progress Notes (Signed)
Calorie Count Note  48 hour calorie count ordered.  Diet: Dysphagia 3 thin liquids Supplements: Ensure Enlive, Boost Breeze, Pro-Stat  Breakfast: 292 kcal, 16 grams protein Lunch: 419 kcal, 14 gram protein Dinner: 134 kcal, 6 grams protein Supplements: 800 kcal, 70 grams protein  Total intake: 1645 kcal (110% of minimum estimated needs)  106 grams protein (125% of minimum estimated needs)  Note: Supplements were document as given in manage orders, but no supplements were documented in calorie count. Pt had empty carton of Boost Breeze at bedside at time of visit.   Nutrition Dx: Increased nutrient needs related to wound healing as evidenced by estimated needs. Ongoing  Goal:  Patient will meet greater than or equal to 90% of their needs Being Met  Intervention: Continue Ensure Enlive BID and Pro-Stat BID  Scarlette Ar RD, CSP, LDN Inpatient Clinical Dietitian Pager: [phone] After Hours Pager: [phone]
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
## 2016-04-29 NOTE — Clinical Social Work Placement (Signed)
   CLINICAL SOCIAL WORK PLACEMENT  NOTE  Date:  04/29/2016  Patient Details  Name: Katherine Chapman MRN: [id number] Date of Birth: 07/17/1930  Clinical Social Work is seeking post-discharge placement for this patient at the Skilled  Nursing Facility level of care (*CSW will initial, date and re-position this form in  chart as items are completed):  Yes   Patient/family provided with [facility] Clinical Social Work Department's list of facilities offering this level of care within the geographic area requested by the patient (or if unable, by the patient's family).  Yes   Patient/family informed of their freedom to choose among providers that offer the needed level of care, that participate in Medicare, Medicaid or managed care program needed by the patient, have an available bed and are willing to accept the patient.  Yes   Patient/family informed of [facility]'s ownership interest in [facility] and [facility], as well as of the fact that they are under no obligation to receive care at these facilities.  PASRR submitted to EDS on 04/29/16     PASRR number received on 04/29/16     Existing PASRR number confirmed on       FL2 transmitted to all facilities in geographic area requested by pt/family on 04/29/16     FL2 transmitted to all facilities within larger geographic area on       Patient informed that his/her managed care company has contracts with or will negotiate with certain facilities, including the following:            Patient/family informed of bed offers received.  Patient chooses bed at       Physician recommends and patient chooses bed at      Patient to be transferred to   on  .  Patient to be transferred to facility by       Patient family notified on   of transfer.  Name of family member notified:        PHYSICIAN Please sign FL2     Additional Comment:    _______________________________________________ Margarito LinerSarah C Antoneo Ghrist, LCSW 04/29/2016, 11:56  AM

## 2016-04-29 NOTE — Progress Notes (Signed)
Speech Language Pathology Treatment: Dysphagia  Patient Details Name: Katherine Chapman MRN: [id number] DOB: 06/06/1930 Today's Date: 04/29/2016 Time: 1020-1030 SLP Time Calculation (min) (ACUTE ONLY): 10 min  Assessment / Plan / Recommendation Clinical Impression  Ongoing f/u for dysphagia - pt has several risk factors for the development of aspiration pna, including her dementia, compromised posture/positioning in bed when eating, and dependence on others for feeding.  When positioned with HOB elevated and provided small bites/sips at slow rate, pt appears to be protecting airway with no overt s/s of aspiration.  Her lungs are diminished at the bases, she remains afebrile.  Will continue to follow for family education if appropriate and await Palliative Medicine assessment, goals of care.    HPI HPI: 82 y.o. female with medical history significant of HTN, CHF, CVA, and dementia nonverbal; who presents with progressively worsening shortness of breath over the last 4 days.      SLP Plan  Continue with current plan of care     Recommendations  Diet recommendations: Thin liquid;Dysphagia 3 (mechanical soft) Liquids provided via: Straw Medication Administration: Whole meds with puree Supervision: Staff to assist with self feeding Compensations: Slow rate;Small sips/bites;Minimize environmental distractions;Follow solids with liquid Postural Changes and/or Swallow Maneuvers: Seated upright 90 degrees;Upright 30-60 min after meal                Oral Care Recommendations: Oral care BID Plan: Continue with current plan of care       GO                [name], [name] 04/29/2016, 10:36 AM  [name], [place]MA CCC/SLP Pager [phone]

## 2016-04-30 LAB — CBC
HEMATOCRIT: 30.9 % — AB (ref 36.0–46.0)
Hemoglobin: 10 g/dL — ABNORMAL LOW (ref 12.0–15.0)
MCH: 25.3 pg — AB (ref 26.0–34.0)
MCHC: 32.4 g/dL (ref 30.0–36.0)
MCV: 78.2 fL (ref 78.0–100.0)
PLATELETS: 274 10*3/uL (ref 150–400)
RBC: 3.95 MIL/uL (ref 3.87–5.11)
RDW: 15.6 % — AB (ref 11.5–15.5)
WBC: 7.3 10*3/uL (ref 4.0–10.5)

## 2016-04-30 MED ORDER — OXYCODONE HCL 5 MG PO TABS: 5.0000 mg | ORAL_TABLET | ORAL | Status: DC | PRN

## 2016-04-30 NOTE — Progress Notes (Addendum)
                                                   Daily Progress Note   Patient Name: Katherine Chapman       Date: 04/30/2016 DOB: 04/25/1931  Age: 82 y.o. MRN#: [id number] Attending Physician: Preetha Joseph, MD Primary Care Physician: No primary care provider on file. Admit Date: 04/22/2016  Reason for Consultation/Follow-up: Establishing goals of care, Pain control and Psychosocial/spiritual support  Subjective: Katherine Chapman was minimally responsive when I first met her two days prior. Today, there was dramatic improvement in her cognitive function. She was awake and alert on my arrival, and she was appropriately verbally responsive to my questions. She was able to follow simple commands consistently, though struggled with more complex ones. Symptomatically, her only complaint is some low back pain, which she feels would be helped by a heating pack. When asked about her family, she reports being eager to see her daughter.   Length of Stay: 7  Current Medications: Scheduled Meds:  . arformoterol  15 mcg Nebulization BID  . budesonide (PULMICORT) nebulizer solution  0.5 mg Nebulization BID  . carvedilol  3.125 mg Oral BID WC  . cefTRIAXone (ROCEPHIN)  IV  2 g Intravenous Q24H  . collagenase   Topical Daily  . enoxaparin (LOVENOX) injection  30 mg Subcutaneous Q24H  . feeding supplement (ENSURE ENLIVE)  237 mL Oral BID BM  . feeding supplement (PRO-STAT SUGAR FREE 64)  30 mL Oral BID  . furosemide  20 mg Oral Daily  . multivitamin with minerals  1 tablet Oral Daily  . nitroGLYCERIN  0.5 inch Topical Q6H  . sodium chloride flush  3 mL Intravenous Q12H    Continuous Infusions:  PRN Meds: sodium chloride, acetaminophen, ipratropium-albuterol, ondansetron (ZOFRAN) IV, sodium chloride flush  Physical Exam      Constitutional: She appears awake and alert. She has a sickly appearance.  Frail and  thin lady curled up in bed   HENT:  Mouth/Throat: Mucous membranes are normal.  Eyes:  Vision loss in right eye, diminished vision in left eye  Cardiovascular: Normal rate.   Pulmonary/Chest: Effort normal and breath sounds normal. No accessory muscle usage. No tachypnea.  Abdominal: Soft. Bowel sounds are normal.  Musculoskeletal:  Contractures noted. Increased movement on left side of body. Generalized weakness, and unable to reposition self  Neurological: She appears alert and awake. Appropriately responds to questions.   Skin: Skin is warm and dry.  Multiple pressure ulcers noted with dressing CDI  Psychiatric: Her speech is delayed. She is slowed. Calm and alert. Known dementia, however much more interactive today.  Vital Signs: BP (!) 179/77 (BP Location: Right Arm)   Pulse 80   Temp 97.3 F (36.3 C) (Oral)   Resp 18   Wt 51.3 kg (113 lb)   SpO2 99%   BMI 17.70 kg/m  SpO2: SpO2: 99 % O2 Device: O2 Device: Nasal Cannula O2 Flow Rate: O2 Flow Rate (L/min): 2 L/min  Intake/output summary:  Intake/Output Summary (Last 24 hours) at 04/30/16 1114 Last data filed at 04/30/16 1048  Gross per 24 hour  Intake              463 ml  Output             1550 ml    Net            -1087 ml   LBM: Last BM Date: 04/29/16 Baseline Weight: Weight: 60.3 kg (133 lb) (bed) Most recent weight: Weight: 51.3 kg (113 lb)       Palliative Assessment/Data:  PPS 10%   Flowsheet Rows   Flowsheet Row Most Recent Value  Intake Tab  Referral Department  Hospitalist  Unit at Time of Referral  Cardiac/Telemetry Unit  Palliative Care Primary Diagnosis  Cardiac  Date Notified  04/25/16  Palliative Care Type  New Palliative care  Reason for referral  Clarify Goals of Care  Date of Admission  04/22/16  Date first seen by Palliative Care  04/29/16  # of days Palliative referral response time  4 Day(s)  # of days IP prior to Palliative referral  3  Clinical Assessment  Psychosocial & Spiritual  Assessment  Palliative Care Outcomes  Patient/Family meeting held?  Yes  Who was at the meeting?  Via phone with Otila Kluver and Stanton Kidney (aunt and daughter)  Palliative Care Outcomes  Clarified goals of care, Provided psychosocial or spiritual support      Patient Active Problem List   Diagnosis Date Noted  . Goals of care, counseling/discussion   . Palliative care encounter   . CHF (congestive heart failure) ([place]) 04/23/2016  . Pressure injury of skin 04/23/2016  . Hyperkalemia 04/23/2016  . Elevated troponin 04/23/2016  . Hypoalbuminemia 04/23/2016  . Malnutrition of moderate degree 04/23/2016  . Essential hypertension 08/12/2014  . Chronic kidney disease, stage III (moderate) 08/12/2014  . Acute renal failure ([place]) 08/12/2014  . Dementia 08/12/2014  . History of stroke 08/12/2014  . Hypokalemia 08/12/2014  . Syncope, brief 08/11/2014  . History of loop recorder 08/11/2014  . Cellulitis 07/21/2014    Palliative Care Assessment & Plan   HPI: 82 y.o. female  with past medical history of CHF, CKD, HTN, Dementia, and CVA who was  admitted on 04/22/2016 with progressive shortness of breath. Per family, pt had relocated to [facility] in February 2016 to live with her son. The last time her daughter and two sisters saw her she was verbal, ambulatory, and preforming most of her ADLs with minimal assistance. This admission, she presented bed-bound, non-verbal, Albumin 1.4, and wtih multiple bedsores. Diagnostically, she had an acute exacerbation of her known CHF, Pulmonary HTN, and Sepsis (either from UTI or sores).   Assessment: Katherine Chapman appears much better this morning with cognitive function closer to what her daughter described from one year prior. I suspect her UTI and Sepsis resulted in cognitive deterioration, and resolution of infection has enabled a return to her baseline.   I was able to reconnect with pt's daughter, Stanton Kidney, and her sister, Otila Kluver. They discussed things overnight, and agree that a  DNR code status is best aligned with their goals for Katherine Chapman: comfort, dignity, and quality of life. I clarified and explained DNR means no CPR, defibrillation, or intubation in the event that her heart or lungs stop, and they were clear on agreeing with this. I was also able to update them on Mrs. Vicens's clinical changes today, which they were extremely happy to hear.   Recommendations/Plan:  DNR; order placed and yellow form filled out and placed in chart  Continue with full scope care; family remains focused on getting pt to rehab then plan to bring her home to [facility] house. Per Stanton Kidney, she will coordinate with her two aunts to provide 24 hour care  Plan for  discharge to SNF with Palliative once deemed ready by primary team  I will call [name] tomorrow for a clinical update, per her request  [id number]  Back pain: K-pad ordered, to be placed on lower back. PRN tylenol and oxycodone  Goals of Care and Additional Recommendations:  Limitations on Scope of Treatment: Full Scope Treatment  Code Status:  DNR  Prognosis:   Unable to determine  Discharge Planning:  Skilled Nursing Facility for rehab with Palliative care service follow-up  Care plan was discussed with [name] (daughter), and Tina (sister)  Thank you for allowing the Palliative Medicine Team to assist in the care of this patient.   Time In: 1120 Time Out: 1145 Total Time 25 minutes Prolonged Time Billed  no       Greater than 50%  of this time was spent counseling and coordinating care related to the above assessment and plan.   , NP Palliative Medicine Team Team Phone # [phone]  

## 2016-04-30 NOTE — Care Management Note (Signed)
Case Management Note  Patient Details  Name: Katherine Chapman MRN: [id number] Date of Birth: 07/17/1930  Subjective/Objective:  82 y.o. F with hx of Dementia admitted with UTI/Sepsis and CHF exacerbation. Pt had been living with D[name] [facility] [place] [facility] daughter, [name] lives. Pt is at baseline cognitively. Plan is for her to go to SNF for Rehab and eventually return to home with daughter.                   Action/Plan:Disposition has been deferred to CSW at present. CM will sign off for now but will be available should additional needs arise.    Expected Discharge Date:                  Expected Discharge Plan:  Skilled Nursing Facility  In-House Referral:  Clinical Social Work  Discharge planning Services  CM Consult  Post Acute Care Choice:  NA Choice offered to:  NA (Review of chart)  DME Arranged:  N/A DME Agency:  NA  HH Arranged:  NA HH Agency:  NA  Status of Service:  Completed, signed off  If discussed at Long Length of Stay Meetings, dates discussed:    Additional Comments:  Yvone NeuCrutchfield, [name], RN 04/30/2016, 11:53 AM

## 2016-04-30 NOTE — Progress Notes (Signed)
Calorie Count Note  48 hour calorie count ordered and now complete  Diet: Dysphagia 3 with thin liquids Supplements: Ensure Enlive BID and Pro-stat BID  Breakfast: 317 kcal, 9 grams protein Lunch: 399 kcal, 16 grams protein Dinner: 318 kcal, 15 grams protein Supplements: 900 kcal, 70 grams protein  Total intake: 1934 kcal (129% of minimum estimated needs)  110 grams protein (129% of minimum estimated needs)  RN reports that patient is eating most of her meals and drinking the Ensure Enlive very well. She seems to like yogurt and applesauce as well and has been taking the Pro-Stat supplement without difficulty.   Nutrition Dx: Increased nutrient needsrelated to wound healingas evidenced by estimated needs. Ongoing  Goal:  Patient will meet greater than or equal to 90% of their needs Being Met  Intervention: Continue Ensure Enlive BID and Pro-Stat BID  Scarlette Ar RD, CSP, LDN Inpatient Clinical Dietitian Pager: [phone] After Hours Pager: [phone]

## 2016-04-30 NOTE — Progress Notes (Signed)
Dressing changes completed per order. Pt tolerated procedure well. Will continue to monitor.

## 2016-04-30 NOTE — Care Management Important Message (Signed)
Important Message  Patient Details  Name: Lurline Harenna Guzzetta MRN: [id number] Date of Birth: 07/30/1930   Medicare Important Message Given:  Yes    Kyla BalzarineShealy, Shaquira Moroz Abena 04/30/2016, 10:38 AM
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
## 2016-05-01 LAB — CBC
HCT: 30.6 % — ABNORMAL LOW (ref 36.0–46.0)
Hemoglobin: 9.6 g/dL — ABNORMAL LOW (ref 12.0–15.0)
MCH: 24.7 pg — AB (ref 26.0–34.0)
MCHC: 31.4 g/dL (ref 30.0–36.0)
MCV: 78.9 fL (ref 78.0–100.0)
PLATELETS: 247 10*3/uL (ref 150–400)
RBC: 3.88 MIL/uL (ref 3.87–5.11)
RDW: 16.4 % — ABNORMAL HIGH (ref 11.5–15.5)
WBC: 6.1 10*3/uL (ref 4.0–10.5)

## 2016-05-01 LAB — BASIC METABOLIC PANEL
ANION GAP: 4 — AB (ref 5–15)
BUN: 43 mg/dL — ABNORMAL HIGH (ref 6–20)
CALCIUM: 7.9 mg/dL — AB (ref 8.9–10.3)
CO2: 34 mmol/L — AB (ref 22–32)
CREATININE: 1.27 mg/dL — AB (ref 0.44–1.00)
Chloride: 102 mmol/L (ref 101–111)
GFR, EST AFRICAN AMERICAN: 44 mL/min — AB (ref >60)
GFR, EST NON AFRICAN AMERICAN: 38 mL/min — AB (ref >60)
Glucose, Bld: 99 mg/dL (ref 65–99)
Potassium: 4.3 mmol/L (ref 3.5–5.1)
SODIUM: 140 mmol/L (ref 135–145)

## 2016-05-01 NOTE — Progress Notes (Signed)
Dressing changes completed per order. Patient tolerated well. Will continue to monitor.

## 2016-05-01 NOTE — Progress Notes (Addendum)
Daily Progress Note   Patient Name: Katherine Chapman       Date: 05/01/2016 DOB: 08/20/1930  Age: 82 y.o. MRN#: [id number] Attending Physician: Zannie CovePreetha Joseph, MD Primary Care Physician: No primary care provider on file. Admit Date: 04/22/2016  Reason for Consultation/Follow-up: Establishing goals of care, Pain control and Psychosocial/spiritual support  Subjective: Mrs. Rosalia HammersRay is similar to yesterday. She is now verbally interactive, and can consistently answer simple questions. She can also follow simple commands, and more complex commands with verbal cues. Her only complaint today is feeling hungry. She denies back discomfort.   Length of Stay: 8  Current Medications: Scheduled Meds:  . arformoterol  15 mcg Nebulization BID  . budesonide (PULMICORT) nebulizer solution  0.5 mg Nebulization BID  . carvedilol  3.125 mg Oral BID WC  . cefTRIAXone (ROCEPHIN)  IV  2 g Intravenous Q24H  . collagenase   Topical Daily  . enoxaparin (LOVENOX) injection  30 mg Subcutaneous Q24H  . feeding supplement (ENSURE ENLIVE)  237 mL Oral BID BM  . feeding supplement (PRO-STAT SUGAR FREE 64)  30 mL Oral BID  . furosemide  20 mg Oral Daily  . multivitamin with minerals  1 tablet Oral Daily  . nitroGLYCERIN  0.5 inch Topical Q6H  . sodium chloride flush  3 mL Intravenous Q12H    Continuous Infusions:  PRN Meds: sodium chloride, acetaminophen, ipratropium-albuterol, ondansetron (ZOFRAN) IV, oxyCODONE, sodium chloride flush  Physical Exam      Constitutional: She appears awake and alert. She has a sickly appearance.  Frail and thin lady curled up in bed   HENT:  Mouth/Throat: Mucous membranes are normal.  Cardiovascular: Normal rate.   Pulmonary/Chest: Effort normal and breath sounds normal. No accessory muscle usage. No tachypnea.  Abdominal: Soft. Bowel  sounds are normal.  Musculoskeletal:  Contractures noted. Generalized weakness, and unable to reposition self  Neurological: She appears alert and awake. Appropriately responds to questions.   Skin: Skin is warm and dry.  Multiple pressure ulcers noted with dressing CDI  Psychiatric: Her speech is delayed. She is slowed. Calm and alert. Known dementia.  Vital Signs: BP (!) 160/85 (BP Location: Right Arm)   Pulse 91   Temp 98.2 F (36.8 C) (Oral)   Resp 18   Wt 52.6 kg (116 lb)   SpO2 100%   BMI 18.17 kg/m  SpO2: SpO2: 100 % O2 Device: O2 Device: Nasal Cannula O2 Flow Rate: O2 Flow Rate (L/min): 2 L/min  Intake/output summary:   Intake/Output Summary (Last 24 hours) at 05/01/16 0829 Last data filed at 05/01/16 0511  Gross per 24 hour  Intake              600 ml  Output             1050 ml  Net             -450 ml   LBM: Last BM Date: 04/29/16 Baseline Weight: Weight: 60.3 kg (133 lb) (bed) Most recent weight: Weight: 52.6 kg (116 lb)       Palliative Assessment/Data:  PPS 10%   Flowsheet Rows   Flowsheet Row Most Recent Value  Intake Tab  Referral Department  Hospitalist  Unit at Time of Referral  Cardiac/Telemetry Unit  Palliative Care Primary Diagnosis  Cardiac  Date Notified  04/25/16  Palliative Care Type  New Palliative care  Reason for referral  Clarify Goals of Care  Date of Admission  04/22/16  Date first seen by Palliative Care  04/29/16  # of days Palliative referral response time  4 Day(s)  # of days IP prior to Palliative referral  3  Clinical Assessment  Psychosocial & Spiritual Assessment  Palliative Care Outcomes  Patient/Family meeting held?  Yes  Who was at the meeting?  Via phone with [name] and [name] (aunt and daughter)  Palliative Care Outcomes  Clarified goals of care, Provided psychosocial or spiritual support      Patient Active Problem List   Diagnosis Date Noted  . Goals of care, counseling/discussion   . Palliative care encounter    . CHF (congestive heart failure) (HCC) 04/23/2016  . Pressure injury of skin 04/23/2016  . Hyperkalemia 04/23/2016  . Elevated troponin 04/23/2016  . Hypoalbuminemia 04/23/2016  . Malnutrition of moderate degree 04/23/2016  . Essential hypertension 08/12/2014  . Chronic kidney disease, stage III (moderate) 08/12/2014  . Acute renal failure (HCC) 08/12/2014  . Dementia 08/12/2014  . History of stroke 08/12/2014  . Hypokalemia 08/12/2014  . Syncope, brief 08/11/2014  . History of loop recorder 08/11/2014  . Cellulitis 07/21/2014    Palliative Care Assessment & Plan   HPI: 82 y.o. female  with past medical history of CHF, CKD, HTN, Dementia, and CVA who was  admitted on 04/22/2016 with progressive shortness of breath. Per family, pt had relocated to [facility] in February 2016 to live with her son. The last time her daughter and two sisters saw her she was verbal, ambulatory, and preforming most of her ADLs with minimal assistance. This admission, she presented bed-bound, non-verbal, Albumin 1.4, and with multiple bedsores. Diagnostically, she had an acute exacerbation of her known CHF, Pulmonary HTN, and Sepsis (either from UTI or sores).   Assessment: Mrs. Sienkiewicz appears to have returned to her baseline cognitive function, as described by her daughter. She remains on IV antibiotics for her UTI with resultant sepsis.   After extensive conversations with the patient's daughter, [name], Mrs. Kulig's code status was changed to DNR. [name]'s end goal is to have her mother live with her in [place] for the remainder of her life. [name] has been updated that Mrs. Eklund has many serious medical conditions, including CHF, a large pericardial effusion, dementia, and malnutrition. It is difficult to determine how much time she has left, but I explained I would expect she has less than 6 months based on her medical conditions, frailty, and potential for acute deterioration. Currently, the plan is for Mrs. Kohlman to go to a  SNF with Palliative Care as close to her family in [place] as possible. At discharge from that facility she will then go live with [facility] with Hospice support.    Recommendations/Plan:  DNR; order placed and yellow form filled out and placed in chart  Continue with full scope care; family remains focused on getting pt to SNF then plan to bring her home to [facility] house. Per [name], she will coordinate with her two aunts to provide 24 hour care  SW asked to educate family on setting up Hospice when she is ready for discharge from SNF  **Pt and family have clear goals of care and a safe discharge plan. Pt is symptomatically well managed. Palliative Care will  follow peripherally at this point**  SYMPTOMS  Back pain: K-pad, PRN tylenol and oxycodone  Goals of Care and Additional Recommendations:  Limitations on Scope of Treatment: Full Scope Treatment  Code Status:  DNR  Prognosis:   < 6 months; large pericardial effusion, CHF, severe malnutrition in the setting of dementia.   Discharge Planning:  Skilled Nursing Facility for rehab with Palliative care service follow-up  Care plan was discussed with [name] (daughter), and SW.  Thank you for allowing the Palliative Medicine Team to assist in the care of this patient.   Time In: 0830 Time Out: 0855 Total Time  25 minutes Prolonged Time Billed  no       Greater than 50%  of this time was spent counseling and coordinating care related to the above assessment and plan.  Murrell ConverseSarah Trueman Worlds, NP Palliative Medicine Team Team Phone # [phone]
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
## 2016-05-01 NOTE — Clinical Social Work Note (Signed)
CSW spoke with patient's daughter and provided bed offers. Patient's daughter has chosen [facility]. Admissions coordinator notified and CSW told her that patient will need palliative care and an air mattress at the facility. CSW notified admissions coordinator of plan to move to [place] with home hospice to be with her daughter and two sisters.  Charlynn CourtSarah Eathan Groman, CSW [phone]

## 2016-05-02 ENCOUNTER — Encounter (HOSPITAL_COMMUNITY): Payer: Self-pay | Admitting: *Deleted

## 2016-05-02 DIAGNOSIS — Z880 Allergy status to penicillin: Secondary | ICD-10-CM

## 2016-05-02 DIAGNOSIS — I130 Hypertensive heart and chronic kidney disease with heart failure and stage 1 through stage 4 chronic kidney disease, or unspecified chronic kidney disease: Principal | ICD-10-CM

## 2016-05-02 DIAGNOSIS — Z95818 Presence of other cardiac implants and grafts: Secondary | ICD-10-CM

## 2016-05-02 DIAGNOSIS — Z66 Do not resuscitate: Secondary | ICD-10-CM

## 2016-05-02 DIAGNOSIS — E43 Unspecified severe protein-calorie malnutrition: Secondary | ICD-10-CM

## 2016-05-02 DIAGNOSIS — Z8673 Personal history of transient ischemic attack (TIA), and cerebral infarction without residual deficits: Secondary | ICD-10-CM

## 2016-05-02 DIAGNOSIS — I313 Pericardial effusion (noninflammatory): Secondary | ICD-10-CM

## 2016-05-02 LAB — BLOOD CULTURE ID PANEL (REFLEXED)
Acinetobacter baumannii: NOT DETECTED
Candida albicans: NOT DETECTED
Candida glabrata: NOT DETECTED
Candida krusei: NOT DETECTED
Candida parapsilosis: NOT DETECTED
Candida tropicalis: NOT DETECTED
ENTEROCOCCUS SPECIES: NOT DETECTED
Enterobacter cloacae complex: NOT DETECTED
Enterobacteriaceae species: NOT DETECTED
Escherichia coli: NOT DETECTED
HAEMOPHILUS INFLUENZAE: NOT DETECTED
Klebsiella oxytoca: NOT DETECTED
Klebsiella pneumoniae: NOT DETECTED
LISTERIA MONOCYTOGENES: NOT DETECTED
Methicillin resistance: DETECTED — AB
NEISSERIA MENINGITIDIS: NOT DETECTED
PROTEUS SPECIES: NOT DETECTED
Pseudomonas aeruginosa: NOT DETECTED
SERRATIA MARCESCENS: NOT DETECTED
STAPHYLOCOCCUS AUREUS BCID: DETECTED — AB
STAPHYLOCOCCUS SPECIES: DETECTED — AB
STREPTOCOCCUS AGALACTIAE: NOT DETECTED
STREPTOCOCCUS SPECIES: NOT DETECTED
Streptococcus pneumoniae: NOT DETECTED
Streptococcus pyogenes: NOT DETECTED

## 2016-05-02 LAB — CBC
HCT: 28.3 % — ABNORMAL LOW (ref 36.0–46.0)
Hemoglobin: 8.9 g/dL — ABNORMAL LOW (ref 12.0–15.0)
MCH: 24.6 pg — AB (ref 26.0–34.0)
MCHC: 31.4 g/dL (ref 30.0–36.0)
MCV: 78.2 fL (ref 78.0–100.0)
PLATELETS: 301 10*3/uL (ref 150–400)
RBC: 3.62 MIL/uL — AB (ref 3.87–5.11)
RDW: 16.5 % — ABNORMAL HIGH (ref 11.5–15.5)
WBC: 6.4 10*3/uL (ref 4.0–10.5)

## 2016-05-02 LAB — BASIC METABOLIC PANEL
Anion gap: 8 (ref 5–15)
BUN: 45 mg/dL — ABNORMAL HIGH (ref 6–20)
CALCIUM: 7.9 mg/dL — AB (ref 8.9–10.3)
CO2: 32 mmol/L (ref 22–32)
CREATININE: 1.16 mg/dL — AB (ref 0.44–1.00)
Chloride: 100 mmol/L — ABNORMAL LOW (ref 101–111)
GFR calc non Af Amer: 42 mL/min — ABNORMAL LOW (ref >60)
GFR, EST AFRICAN AMERICAN: 49 mL/min — AB (ref >60)
Glucose, Bld: 89 mg/dL (ref 65–99)
Potassium: 4.2 mmol/L (ref 3.5–5.1)
Sodium: 140 mmol/L (ref 135–145)

## 2016-05-02 MED ORDER — VANCOMYCIN HCL IN DEXTROSE 750-5 MG/150ML-% IV SOLN
750.0000 mg | INTRAVENOUS | Status: DC
  Administered 2016-05-03 – 2016-05-05 (×3): 750 mg via INTRAVENOUS
  Filled 2016-05-02 (×4): qty 150

## 2016-05-02 MED ORDER — VANCOMYCIN HCL IN DEXTROSE 1-5 GM/200ML-% IV SOLN
1000.0000 mg | Freq: Once | INTRAVENOUS | Status: AC
Start: 2016-05-02 — End: 2016-05-02
  Administered 2016-05-02: 1000 mg via INTRAVENOUS
  Filled 2016-05-02: qty 200
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
## 2016-05-02 NOTE — Progress Notes (Signed)
Speech Language Pathology Treatment: Dysphagia  Patient Details Name: Katherine Chapman MRN: [id number] DOB: 12/17/1930 Today's Date: 05/02/2016 Time: [id number] SLP Time Calculation (min) (ACUTE ONLY): 12 min  Assessment / Plan / Recommendation Clinical Impression  Pt's swallow function remains consistent.  When she is alert/attentive, she appears to be protecting her airway and eager to drink (waters, Ensure especially).  Solids are more difficult to masticate -recommend downgrading diet to Dysphagia 2.  Pt is being followed by Palliative Medicine.  She is total assist for feeding.  Dependency on others for feeding has been found to be the dominant risk factor for developing aspiration pna (Langmore et al 1998).  Recommend continuing oral diet with focus on appropriate positioning and attention to pt's comfort.  No further SLP needs identified.  Our services will sign off.    HPI HPI: 82 y.o. female with medical history significant of HTN, CHF, CVA, and dementia nonverbal; who presents with progressively worsening shortness of breath over the last 4 days.      SLP Plan  Discharge SLP treatment due to (comment) - baseline function.    Recommendations  Diet recommendations: Dysphagia 2 (fine chop);Thin liquid Liquids provided via: Straw Medication Administration: Whole meds with puree Supervision: Staff to assist with self feeding Compensations: Slow rate;Small sips/bites;Minimize environmental distractions;Follow solids with liquid Postural Changes and/or Swallow Maneuvers: Seated upright 90 degrees;Upright 30-60 min after meal                Oral Care Recommendations: Oral care BID Follow up Recommendations: 24 hour supervision/assistance Plan: Discharge SLP treatment due to (comment)       GO               Katherine Chapman, [place] CCC/SLP Pager [phone]  Katherine Chapman, Katherine Chapman 05/02/2016, 9:33 AM

## 2016-05-02 NOTE — Consult Note (Signed)
[name]  Date of Admission:  04/22/2016  Date of Consult:  05/02/2016  Reason for Consult: MRSA Bacteremia Referring Physician: CHAMP  Impression/Recommendation MRSA Bacteremia  CHF  EF 30-35%, PA pressure 79  Pericardial effusion HTN  To be managed by primary Loop Recorder CVA CKD III  Cr improved to 1.16 Dementia Decubitus Ulcers Gaurdianship DNR Severe protein calorie malnutrition  Alb 1.4 (11-27)  Would Continue vancomycin- aim for 2 weeks.  Repeat BCx in AM Consider repeat TTE  Comment- Would not put pt through TEE. She is being considered for hospice which I strongly agree with.  There is the risk that she has infected her loop recorder (although this is not noted clinically). It is also possible that the MRSA is a contaminant (this is not a usual consideration, risk benefit usually favors treating).   Thank you so much for this interesting consult,   Bobby Rumpf (pager) [phone] [email or web address]  Katherine Chapman is an 82 y.o. female.  HPI: 82 yo F with hx of CHF adm on 11-22 with 4 days of worsening SOB. She lived at home, cared for by her son. She was non-verbal.  She was started on vanco/fortaz due to elevated lactic acid and bed sores. Her BNP was 1908.  She was diuresed, found to have large, loculated, posterior, pericardial effusion on TTE. She is not felt to be a candidate for a window.  She was eval by hospice.  She was found to have UCx E coli (pan-sens) and BCx P mirabilis (pan-sens) from admission. Her vanco was stopped on 11-23. Ceftaz was changed to ceftriaxone.  By 11-26 she developed drp in HCT. She was transfused.  By 11-28 she had improved. Was able to converse with staff.  She had repeat BCx done on 11-30 to eval for clearance, which have now grown MRSA 1/2.   Has been afebrile.   Past Medical History:  Diagnosis Date  . CHF (congestive heart failure) ([place])   . History of loop recorder   .  Hypertension   . Stroke [facility])     Past Surgical History:  Procedure Laterality Date  . EP IMPLANTABLE DEVICE     loop recorder  . TEE WITHOUT CARDIOVERSION N/A 08/14/2014   Procedure: TRANSESOPHAGEAL ECHOCARDIOGRAM (TEE);  Surgeon: Lelon Perla, MD;  Location: [facility] ENDOSCOPY;  Service: Cardiovascular;  Laterality: N/A;     Allergies  Allergen Reactions  . Penicillins Hives    Medications:  Scheduled: . arformoterol  15 mcg Nebulization BID  . budesonide (PULMICORT) nebulizer solution  0.5 mg Nebulization BID  . carvedilol  3.125 mg Oral BID WC  . cefTRIAXone (ROCEPHIN)  IV  2 g Intravenous Q24H  . collagenase   Topical Daily  . enoxaparin (LOVENOX) injection  30 mg Subcutaneous Q24H  . feeding supplement (ENSURE ENLIVE)  237 mL Oral BID BM  . feeding supplement (PRO-STAT SUGAR FREE 64)  30 mL Oral BID  . furosemide  20 mg Oral Daily  . multivitamin with minerals  1 tablet Oral Daily  . nitroGLYCERIN  0.5 inch Topical Q6H  . sodium chloride flush  3 mL Intravenous Q12H  . [START ON 05/03/2016] vancomycin  750 mg Intravenous Q24H    Abtx:  Anti-infectives    Start     Dose/Rate Route Frequency Ordered Stop   05/03/16 0900  vancomycin (VANCOCIN) IVPB 750 mg/150 ml premix     750 mg 150 mL/hr over 60 Minutes Intravenous Every 24 hours 05/02/16  1914     05/02/16 0845  vancomycin (VANCOCIN) IVPB 1000 mg/200 mL premix     1,000 mg 200 mL/hr over 60 Minutes Intravenous  Once 05/02/16 0833 05/02/16 1139   04/24/16 0600  cefTRIAXone (ROCEPHIN) 2 g in dextrose 5 % 50 mL IVPB     2 g 100 mL/hr over 30 Minutes Intravenous Every 24 hours 04/23/16 2346 05/05/16 2359   04/23/16 2300  vancomycin (VANCOCIN) IVPB 1000 mg/200 mL premix  Status:  Discontinued     1,000 mg 200 mL/hr over 60 Minutes Intravenous Every 24 hours 04/22/16 2255 04/24/16 1321   04/23/16 1100  cefTAZidime (FORTAZ) 1 g in dextrose 5 % 50 mL IVPB  Status:  Discontinued     1 g 100 mL/hr over 30 Minutes Intravenous  Every 12 hours 04/22/16 2255 04/23/16 2341   04/22/16 2330  cefTAZidime (FORTAZ) 2 g in dextrose 5 % 50 mL IVPB     2 g 100 mL/hr over 30 Minutes Intravenous  Once 04/22/16 2251 04/23/16 0025   04/22/16 2245  piperacillin-tazobactam (ZOSYN) IVPB 3.375 g  Status:  Discontinued     3.375 g 100 mL/hr over 30 Minutes Intravenous  Once 04/22/16 2239 04/22/16 2251   04/22/16 2245  vancomycin (VANCOCIN) IVPB 1000 mg/200 mL premix     1,000 mg 200 mL/hr over 60 Minutes Intravenous  Once 04/22/16 2239 04/23/16 0056      Total days of antibiotics: 10 (ceftriaxone, vancomycin 12-1)          Social History:  reports that she has never smoked. She has never used smokeless tobacco. She reports that she does not drink alcohol or use drugs.  History reviewed. No pertinent family history.  General ROS: see HPI. pt responds minimally to questions. ROS not available. BM normal.   Blood pressure (!) 176/93, pulse 83, temperature 97.6 F (36.4 C), temperature source Oral, resp. rate 16, height 5' 6.93" (1.7 m), weight 54 kg (119 lb), SpO2 91 %. General appearance: alert, cooperative and no distress Eyes: conjunctivae/corneas clear. PERRL, EOM's intact. Fundi benign. Throat: normal findings: oropharynx pink & moist without lesions or evidence of thrush Lungs: clear to auscultation bilaterally and no tenderness or fluctuance at recorder site.  Heart: regular rate and rhythm Abdomen: normal findings: bowel sounds normal and soft, non-tender Extremities: trace edema. multiple wounds.    Results for orders placed or performed during the hospital encounter of 04/22/16 (from the past 48 hour(s))  Basic metabolic panel     Status: Abnormal   Collection Time: 05/01/16 12:59 AM  Result Value Ref Range   Sodium 140 135 - 145 mmol/L   Potassium 4.3 3.5 - 5.1 mmol/L   Chloride 102 101 - 111 mmol/L   CO2 34 (H) 22 - 32 mmol/L   Glucose, Bld 99 65 - 99 mg/dL   BUN 43 (H) 6 - 20 mg/dL   Creatinine, Ser 1.27  (H) 0.44 - 1.00 mg/dL   Calcium 7.9 (L) 8.9 - 10.3 mg/dL   GFR calc non Af Amer 38 (L) >60 mL/min   GFR calc Af Amer 44 (L) >60 mL/min    Comment: (NOTE) The eGFR has been calculated using the CKD EPI equation. This calculation has not been validated in all clinical situations. eGFR's persistently <60 mL/min signify possible Chronic Kidney Disease.    Anion gap 4 (L) 5 - 15  CBC     Status: Abnormal   Collection Time: 05/01/16 12:59 AM  Result Value Ref Range  WBC 6.1 4.0 - 10.5 K/uL   RBC 3.88 3.87 - 5.11 MIL/uL   Hemoglobin 9.6 (L) 12.0 - 15.0 g/dL   HCT 30.6 (L) 36.0 - 46.0 %   MCV 78.9 78.0 - 100.0 fL   MCH 24.7 (L) 26.0 - 34.0 pg   MCHC 31.4 30.0 - 36.0 g/dL   RDW 16.4 (H) 11.5 - 15.5 %   Platelets 247 150 - 400 K/uL  Culture, blood (routine x 2)     Status: None (Preliminary result)   Collection Time: 05/01/16 12:45 PM  Result Value Ref Range   Specimen Description BLOOD RIGHT HAND    Special Requests IN PEDIATRIC BOTTLE 1 ML    Culture  Setup Time      GRAM POSITIVE COCCI IN CLUSTERS IN PEDIATRIC BOTTLE CRITICAL RESULT CALLED TO, READ BACK BY AND VERIFIED WITH: A JOHNSTON,PHARMD AT 3614 05/02/16 BY L BENFIELD    Culture GRAM POSITIVE COCCI    Report Status PENDING   Blood Culture ID Panel (Reflexed)     Status: Abnormal   Collection Time: 05/01/16 12:45 PM  Result Value Ref Range   Enterococcus species NOT DETECTED NOT DETECTED   Listeria monocytogenes NOT DETECTED NOT DETECTED   Staphylococcus species DETECTED (A) NOT DETECTED    Comment: CRITICAL RESULT CALLED TO, READ BACK BY AND VERIFIED WITH: A JOHNSTON,PHARMD AT 4315 05/02/16 BY L BENFIELD    Staphylococcus aureus DETECTED (A) NOT DETECTED    Comment: CRITICAL RESULT CALLED TO, READ BACK BY AND VERIFIED WITH: A JOHNSTON,PHARMD AT 4008 05/02/16 BY L BENFIELD    Methicillin resistance DETECTED (A) NOT DETECTED    Comment: CRITICAL RESULT CALLED TO, READ BACK BY AND VERIFIED WITH: A JOHNSTON,PHARMD AT 0808  05/02/16 BY L BENFIELD    Streptococcus species NOT DETECTED NOT DETECTED   Streptococcus agalactiae NOT DETECTED NOT DETECTED   Streptococcus pneumoniae NOT DETECTED NOT DETECTED   Streptococcus pyogenes NOT DETECTED NOT DETECTED   Acinetobacter baumannii NOT DETECTED NOT DETECTED   Enterobacteriaceae species NOT DETECTED NOT DETECTED   Enterobacter cloacae complex NOT DETECTED NOT DETECTED   Escherichia coli NOT DETECTED NOT DETECTED   Klebsiella oxytoca NOT DETECTED NOT DETECTED   Klebsiella pneumoniae NOT DETECTED NOT DETECTED   Proteus species NOT DETECTED NOT DETECTED   Serratia marcescens NOT DETECTED NOT DETECTED   Haemophilus influenzae NOT DETECTED NOT DETECTED   Neisseria meningitidis NOT DETECTED NOT DETECTED   Pseudomonas aeruginosa NOT DETECTED NOT DETECTED   Candida albicans NOT DETECTED NOT DETECTED   Candida glabrata NOT DETECTED NOT DETECTED   Candida krusei NOT DETECTED NOT DETECTED   Candida parapsilosis NOT DETECTED NOT DETECTED   Candida tropicalis NOT DETECTED NOT DETECTED  Culture, blood (routine x 2)     Status: None (Preliminary result)   Collection Time: 05/01/16 12:51 PM  Result Value Ref Range   Specimen Description BLOOD RIGHT HAND    Special Requests BOTTLES DRAWN AEROBIC AND ANAEROBIC 5 ML    Culture NO GROWTH < 24 HOURS    Report Status PENDING   Basic metabolic panel     Status: Abnormal   Collection Time: 05/02/16  3:53 AM  Result Value Ref Range   Sodium 140 135 - 145 mmol/L   Potassium 4.2 3.5 - 5.1 mmol/L   Chloride 100 (L) 101 - 111 mmol/L   CO2 32 22 - 32 mmol/L   Glucose, Bld 89 65 - 99 mg/dL   BUN 45 (H) 6 - 20 mg/dL  Creatinine, Ser 1.16 (H) 0.44 - 1.00 mg/dL   Calcium 7.9 (L) 8.9 - 10.3 mg/dL   GFR calc non Af Amer 42 (L) >60 mL/min   GFR calc Af Amer 49 (L) >60 mL/min    Comment: (NOTE) The eGFR has been calculated using the CKD EPI equation. This calculation has not been validated in all clinical situations. eGFR's  persistently <60 mL/min signify possible Chronic Kidney Disease.    Anion gap 8 5 - 15  CBC     Status: Abnormal   Collection Time: 05/02/16  3:53 AM  Result Value Ref Range   WBC 6.4 4.0 - 10.5 K/uL   RBC 3.62 (L) 3.87 - 5.11 MIL/uL   Hemoglobin 8.9 (L) 12.0 - 15.0 g/dL   HCT 28.3 (L) 36.0 - 46.0 %   MCV 78.2 78.0 - 100.0 fL   MCH 24.6 (L) 26.0 - 34.0 pg   MCHC 31.4 30.0 - 36.0 g/dL   RDW 16.5 (H) 11.5 - 15.5 %   Platelets 301 150 - 400 K/uL      Component Value Date/Time   SDES BLOOD RIGHT HAND 05/01/2016 1251   SPECREQUEST BOTTLES DRAWN AEROBIC AND ANAEROBIC 5 ML 05/01/2016 1251   CULT NO GROWTH < 24 HOURS 05/01/2016 1251   REPTSTATUS PENDING 05/01/2016 1251   No results found. Recent Results (from the past 240 hour(s))  Culture, blood (Routine x 2)     Status: None   Collection Time: 04/22/16 10:52 PM  Result Value Ref Range Status   Specimen Description BLOOD LEFT ARM  Final   Special Requests BOTTLES DRAWN AEROBIC AND ANAEROBIC 5CC  Final   Culture NO GROWTH 5 DAYS  Final   Report Status 04/28/2016 FINAL  Final  Culture, blood (Routine x 2)     Status: Abnormal   Collection Time: 04/22/16 11:00 PM  Result Value Ref Range Status   Specimen Description BLOOD LEFT HAND  Final   Special Requests IN PEDIATRIC BOTTLE 4CC  Final   Culture  Setup Time   Final    GRAM NEGATIVE RODS AEROBIC BOTTLE ONLY CRITICAL RESULT CALLED TO, READ BACK BY AND VERIFIED WITH: J LEDFORD PHARMD 2310 04/23/16 A BROWNING    Culture PROTEUS MIRABILIS (A)  Final   Report Status 04/25/2016 FINAL  Final   Organism ID, Bacteria PROTEUS MIRABILIS  Final      Susceptibility   Proteus mirabilis - MIC*    AMPICILLIN <=2 SENSITIVE Sensitive     CEFAZOLIN <=4 SENSITIVE Sensitive     CEFEPIME <=1 SENSITIVE Sensitive     CEFTAZIDIME <=1 SENSITIVE Sensitive     CEFTRIAXONE <=1 SENSITIVE Sensitive     CIPROFLOXACIN <=0.25 SENSITIVE Sensitive     GENTAMICIN <=1 SENSITIVE Sensitive     IMIPENEM 4  SENSITIVE Sensitive     TRIMETH/SULFA <=20 SENSITIVE Sensitive     AMPICILLIN/SULBACTAM <=2 SENSITIVE Sensitive     PIP/TAZO <=4 SENSITIVE Sensitive     * PROTEUS MIRABILIS  Blood Culture ID Panel (Reflexed)     Status: Abnormal   Collection Time: 04/22/16 11:00 PM  Result Value Ref Range Status   Enterococcus species NOT DETECTED NOT DETECTED Final   Listeria monocytogenes NOT DETECTED NOT DETECTED Final   Staphylococcus species NOT DETECTED NOT DETECTED Final   Staphylococcus aureus NOT DETECTED NOT DETECTED Final   Streptococcus species NOT DETECTED NOT DETECTED Final   Streptococcus agalactiae NOT DETECTED NOT DETECTED Final   Streptococcus pneumoniae NOT DETECTED NOT DETECTED Final  Streptococcus pyogenes NOT DETECTED NOT DETECTED Final   Acinetobacter baumannii NOT DETECTED NOT DETECTED Final   Enterobacteriaceae species DETECTED (A) NOT DETECTED Final    Comment: CRITICAL RESULT CALLED TO, READ BACK BY AND VERIFIED WITH: J LEDFORD PHARMD 2310 04/23/16 A BROWNING    Enterobacter cloacae complex NOT DETECTED NOT DETECTED Final   Escherichia coli NOT DETECTED NOT DETECTED Final   Klebsiella oxytoca NOT DETECTED NOT DETECTED Final   Klebsiella pneumoniae NOT DETECTED NOT DETECTED Final   Proteus species DETECTED (A) NOT DETECTED Final    Comment: CRITICAL RESULT CALLED TO, READ BACK BY AND VERIFIED WITH: J LEDFORD PHARMD 2310 04/23/16 A BROWNING    Serratia marcescens NOT DETECTED NOT DETECTED Final   Carbapenem resistance NOT DETECTED NOT DETECTED Final   Haemophilus influenzae NOT DETECTED NOT DETECTED Final   Neisseria meningitidis NOT DETECTED NOT DETECTED Final   Pseudomonas aeruginosa NOT DETECTED NOT DETECTED Final   Candida albicans NOT DETECTED NOT DETECTED Final   Candida glabrata NOT DETECTED NOT DETECTED Final   Candida krusei NOT DETECTED NOT DETECTED Final   Candida parapsilosis NOT DETECTED NOT DETECTED Final   Candida tropicalis NOT DETECTED NOT DETECTED  Final  Culture, blood (routine x 2)     Status: None (Preliminary result)   Collection Time: 05/01/16 12:45 PM  Result Value Ref Range Status   Specimen Description BLOOD RIGHT HAND  Final   Special Requests IN PEDIATRIC BOTTLE 1 ML  Final   Culture  Setup Time   Final    GRAM POSITIVE COCCI IN CLUSTERS IN PEDIATRIC BOTTLE CRITICAL RESULT CALLED TO, READ BACK BY AND VERIFIED WITH: A JOHNSTON,PHARMD AT 6759 05/02/16 BY L BENFIELD    Culture GRAM POSITIVE COCCI  Final   Report Status PENDING  Incomplete  Blood Culture ID Panel (Reflexed)     Status: Abnormal   Collection Time: 05/01/16 12:45 PM  Result Value Ref Range Status   Enterococcus species NOT DETECTED NOT DETECTED Final   Listeria monocytogenes NOT DETECTED NOT DETECTED Final   Staphylococcus species DETECTED (A) NOT DETECTED Final    Comment: CRITICAL RESULT CALLED TO, READ BACK BY AND VERIFIED WITH: A JOHNSTON,PHARMD AT 1638 05/02/16 BY L BENFIELD    Staphylococcus aureus DETECTED (A) NOT DETECTED Final    Comment: CRITICAL RESULT CALLED TO, READ BACK BY AND VERIFIED WITH: A JOHNSTON,PHARMD AT 4665 05/02/16 BY L BENFIELD    Methicillin resistance DETECTED (A) NOT DETECTED Final    Comment: CRITICAL RESULT CALLED TO, READ BACK BY AND VERIFIED WITH: A JOHNSTON,PHARMD AT 0808 05/02/16 BY L BENFIELD    Streptococcus species NOT DETECTED NOT DETECTED Final   Streptococcus agalactiae NOT DETECTED NOT DETECTED Final   Streptococcus pneumoniae NOT DETECTED NOT DETECTED Final   Streptococcus pyogenes NOT DETECTED NOT DETECTED Final   Acinetobacter baumannii NOT DETECTED NOT DETECTED Final   Enterobacteriaceae species NOT DETECTED NOT DETECTED Final   Enterobacter cloacae complex NOT DETECTED NOT DETECTED Final   Escherichia coli NOT DETECTED NOT DETECTED Final   Klebsiella oxytoca NOT DETECTED NOT DETECTED Final   Klebsiella pneumoniae NOT DETECTED NOT DETECTED Final   Proteus species NOT DETECTED NOT DETECTED Final   Serratia  marcescens NOT DETECTED NOT DETECTED Final   Haemophilus influenzae NOT DETECTED NOT DETECTED Final   Neisseria meningitidis NOT DETECTED NOT DETECTED Final   Pseudomonas aeruginosa NOT DETECTED NOT DETECTED Final   Candida albicans NOT DETECTED NOT DETECTED Final   Candida glabrata NOT DETECTED NOT DETECTED  Final   Candida krusei NOT DETECTED NOT DETECTED Final   Candida parapsilosis NOT DETECTED NOT DETECTED Final   Candida tropicalis NOT DETECTED NOT DETECTED Final  Culture, blood (routine x 2)     Status: None (Preliminary result)   Collection Time: 05/01/16 12:51 PM  Result Value Ref Range Status   Specimen Description BLOOD RIGHT HAND  Final   Special Requests BOTTLES DRAWN AEROBIC AND ANAEROBIC 5 ML  Final   Culture NO GROWTH < 24 HOURS  Final   Report Status PENDING  Incomplete      05/02/2016, 1:51 PM     LOS: 9 days    Records and images were personally reviewed where available.

## 2016-05-02 NOTE — Progress Notes (Signed)
Pharmacy Antibiotic Note  Katherine Chapman is a 82 y.o. female currently on ceftriaxone for proteus bacteremia and ecoli UTI, now with MRSA in 1/2 repeat blood cultures. Vancomycin will be added back. Renal function wnl.  Vancomycin trough goal 15-20  Plan: 1) Vancomycin 1g IV x 1 then 750mg  IV q24 2) Continue ceftriaxone 2g IV q24  Height: 5' 6.93" (170 cm) Weight: 119 lb (54 kg) IBW/kg (Calculated) : 61.44  Temp (24hrs), Avg:98.1 F (36.7 C), Min:97.6 F (36.4 C), Max:98.4 F (36.9 C)   Recent Labs Lab 04/27/16 0519 04/28/16 0433 04/30/16 0519 05/01/16 0059 05/02/16 0353  WBC 5.5 6.4 7.3 6.1 6.4  CREATININE 1.54* 1.30*  --  1.27* 1.16*    Estimated Creatinine Clearance: 31.3 mL/min (by C-G formula based on SCr of 1.16 mg/dL (H)).    Allergies  Allergen Reactions  . Penicillins Hives    Antimicrobials this admission: 11/21 vancomycin >11/23, resume 12/1>> 11/21 ceftazidime >11/22 11/23 ceftriaxone >>  Dose adjustments this admission: n/a  Microbiology results: 11/21 blood cx: Proteus 11/21 urine cx: Ecoli 11/30 blood cx: 1/2 GPC, BCID MRSA  Thank you for allowing pharmacy to be a part of this patient's care.  Fredrik RiggerMarkle, Aariah Godette Sue 05/02/2016 8:51 AM

## 2016-05-02 NOTE — Progress Notes (Signed)
PHARMACY - PHYSICIAN COMMUNICATION CRITICAL VALUE ALERT - BLOOD CULTURE IDENTIFICATION (BCID)  Results for orders placed or performed during the hospital encounter of 04/22/16  Blood Culture ID Panel (Reflexed) (Collected: 05/01/2016 12:45 PM)  Result Value Ref Range   Enterococcus species NOT DETECTED NOT DETECTED   Listeria monocytogenes NOT DETECTED NOT DETECTED   Staphylococcus species DETECTED (A) NOT DETECTED   Staphylococcus aureus DETECTED (A) NOT DETECTED   Methicillin resistance DETECTED (A) NOT DETECTED   Streptococcus species NOT DETECTED NOT DETECTED   Streptococcus agalactiae NOT DETECTED NOT DETECTED   Streptococcus pneumoniae NOT DETECTED NOT DETECTED   Streptococcus pyogenes NOT DETECTED NOT DETECTED   Acinetobacter baumannii NOT DETECTED NOT DETECTED   Enterobacteriaceae species NOT DETECTED NOT DETECTED   Enterobacter cloacae complex NOT DETECTED NOT DETECTED   Escherichia coli NOT DETECTED NOT DETECTED   Klebsiella oxytoca NOT DETECTED NOT DETECTED   Klebsiella pneumoniae NOT DETECTED NOT DETECTED   Proteus species NOT DETECTED NOT DETECTED   Serratia marcescens NOT DETECTED NOT DETECTED   Haemophilus influenzae NOT DETECTED NOT DETECTED   Neisseria meningitidis NOT DETECTED NOT DETECTED   Pseudomonas aeruginosa NOT DETECTED NOT DETECTED   Candida albicans NOT DETECTED NOT DETECTED   Candida glabrata NOT DETECTED NOT DETECTED   Candida krusei NOT DETECTED NOT DETECTED   Candida parapsilosis NOT DETECTED NOT DETECTED   Candida tropicalis NOT DETECTED NOT DETECTED    Name of physician (or Provider) Contacted: Dr Jomarie LongsJoseph  Changes to prescribed antibiotics required: add vancomycin  Sheron NightingaleJames A Brynleigh Sequeira 05/02/2016  8:36 AM

## 2016-05-03 ENCOUNTER — Inpatient Hospital Stay (HOSPITAL_COMMUNITY): Payer: Medicare Other

## 2016-05-03 DIAGNOSIS — R7881 Bacteremia: Secondary | ICD-10-CM

## 2016-05-03 LAB — ECHOCARDIOGRAM LIMITED
Height: 66.929 in
Weight: 1904 oz

## 2016-05-03 LAB — BASIC METABOLIC PANEL
ANION GAP: 8 (ref 5–15)
BUN: 42 mg/dL — AB (ref 6–20)
CHLORIDE: 101 mmol/L (ref 101–111)
CO2: 29 mmol/L (ref 22–32)
Calcium: 7.9 mg/dL — ABNORMAL LOW (ref 8.9–10.3)
Creatinine, Ser: 1.06 mg/dL — ABNORMAL HIGH (ref 0.44–1.00)
GFR calc Af Amer: 55 mL/min — ABNORMAL LOW (ref >60)
GFR calc non Af Amer: 47 mL/min — ABNORMAL LOW (ref >60)
GLUCOSE: 120 mg/dL — AB (ref 65–99)
POTASSIUM: 4.5 mmol/L (ref 3.5–5.1)
SODIUM: 138 mmol/L (ref 135–145)

## 2016-05-03 LAB — CBC
HCT: 29.6 % — ABNORMAL LOW (ref 36.0–46.0)
HEMOGLOBIN: 9.4 g/dL — AB (ref 12.0–15.0)
MCH: 24.9 pg — AB (ref 26.0–34.0)
MCHC: 31.8 g/dL (ref 30.0–36.0)
MCV: 78.3 fL (ref 78.0–100.0)
Platelets: 357 10*3/uL (ref 150–400)
RBC: 3.78 MIL/uL — AB (ref 3.87–5.11)
RDW: 17 % — ABNORMAL HIGH (ref 11.5–15.5)
WBC: 6.4 10*3/uL (ref 4.0–10.5)

## 2016-05-03 NOTE — Plan of Care (Signed)
Problem: Skin Integrity: Goal: Risk for impaired skin integrity will decrease Outcome: Progressing Dressing changed. No new skin breakdown noted.  Problem: Fluid Volume: Goal: Ability to maintain a balanced intake and output will improve Outcome: Progressing Patient eats well. Patient receives supplemental nutrition in addition to meals.

## 2016-05-03 NOTE — Plan of Care (Signed)
Problem: Health Behavior/Discharge Planning: Goal: Ability to manage health-related needs will improve Outcome: Not Applicable Date Met: 30/16/01 Patient total care, plans for SNF after discharge   Problem: Physical Regulation: Goal: Will remain free from infection Outcome: Not Applicable Date Met: 09/32/35 Patient admitted with positive blood cultures and wound cultures; receiving antibiotics   Problem: Skin Integrity: Goal: Risk for impaired skin integrity will decrease Outcome: Progressing Patient receiving wound care as well as air mattress and nutrition support   Problem: Activity: Goal: Risk for activity intolerance will decrease Outcome: Not Applicable Date Met: 57/32/20 Patient bedbound at baseline, contracted
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
## 2016-05-03 NOTE — Progress Notes (Signed)
Patient in no distress during 7 a to 7 p shift.  Eats very well at all meals, drinks 100% of Ensure and whatever nurse or nurse tech offers patient.  Denies pain.

## 2016-05-03 NOTE — Progress Notes (Signed)
INFECTIOUS DISEASE PROGRESS NOTE  ID: Katherine Chapman is a 82 y.o. female with  Principal Problem:   CHF (congestive heart failure) (HCC) Active Problems:   Essential hypertension   Chronic kidney disease, stage III (moderate)   Dementia   Pressure injury of skin   Hyperkalemia   Elevated troponin   Hypoalbuminemia   Malnutrition of moderate degree   Palliative care encounter   Goals of care, counseling/discussion  Subjective: Awake and alert, interacts with RN  Abtx:  Anti-infectives    Start     Dose/Rate Route Frequency Ordered Stop   05/03/16 0900  vancomycin (VANCOCIN) IVPB 750 mg/150 ml premix     750 mg 150 mL/hr over 60 Minutes Intravenous Every 24 hours 05/02/16 0904     05/02/16 0845  vancomycin (VANCOCIN) IVPB 1000 mg/200 mL premix     1,000 mg 200 mL/hr over 60 Minutes Intravenous  Once 05/02/16 0833 05/02/16 1139   04/24/16 0600  cefTRIAXone (ROCEPHIN) 2 g in dextrose 5 % 50 mL IVPB     2 g 100 mL/hr over 30 Minutes Intravenous Every 24 hours 04/23/16 2346 05/05/16 2359   04/23/16 2300  vancomycin (VANCOCIN) IVPB 1000 mg/200 mL premix  Status:  Discontinued     1,000 mg 200 mL/hr over 60 Minutes Intravenous Every 24 hours 04/22/16 2255 04/24/16 1321   04/23/16 1100  cefTAZidime (FORTAZ) 1 g in dextrose 5 % 50 mL IVPB  Status:  Discontinued     1 g 100 mL/hr over 30 Minutes Intravenous Every 12 hours 04/22/16 2255 04/23/16 2341   04/22/16 2330  cefTAZidime (FORTAZ) 2 g in dextrose 5 % 50 mL IVPB     2 g 100 mL/hr over 30 Minutes Intravenous  Once 04/22/16 2251 04/23/16 0025   04/22/16 2245  piperacillin-tazobactam (ZOSYN) IVPB 3.375 g  Status:  Discontinued     3.375 g 100 mL/hr over 30 Minutes Intravenous  Once 04/22/16 2239 04/22/16 2251   04/22/16 2245  vancomycin (VANCOCIN) IVPB 1000 mg/200 mL premix     1,000 mg 200 mL/hr over 60 Minutes Intravenous  Once 04/22/16 2239 04/23/16 0056      Medications:  Scheduled: . arformoterol  15 mcg Nebulization BID   . budesonide (PULMICORT) nebulizer solution  0.5 mg Nebulization BID  . carvedilol  3.125 mg Oral BID WC  . cefTRIAXone (ROCEPHIN)  IV  2 g Intravenous Q24H  . collagenase   Topical Daily  . enoxaparin (LOVENOX) injection  30 mg Subcutaneous Q24H  . feeding supplement (ENSURE ENLIVE)  237 mL Oral BID BM  . feeding supplement (PRO-STAT SUGAR FREE 64)  30 mL Oral BID  . furosemide  20 mg Oral Daily  . multivitamin with minerals  1 tablet Oral Daily  . nitroGLYCERIN  0.5 inch Topical Q6H  . sodium chloride flush  3 mL Intravenous Q12H  . vancomycin  750 mg Intravenous Q24H    Objective: Vital signs in last 24 hours: Temp:  [97.5 F (36.4 C)-98.7 F (37.1 C)] 98.4 F (36.9 C) (12/02 0539) Pulse Rate:  [90-95] 95 (12/02 0539) Resp:  [18] 18 (12/02 0539) BP: (160-170)/(77-96) 170/96 (12/02 0539) SpO2:  [99 %-100 %] 99 % (12/02 0539) Weight:  [54 kg (119 lb)] 54 kg (119 lb) (12/02 0539)   General appearance: alert and no distress Incision/Wound: large gluteal decubitus on R. Deep.   Lab Results  Recent Labs  05/01/16 0059 05/02/16 0353  WBC 6.1 6.4  HGB 9.6* 8.9*  HCT  30.6* 28.3*  NA 140 140  K 4.3 4.2  CL 102 100*  CO2 34* 32  BUN 43* 45*  CREATININE 1.27* 1.16*   Liver Panel No results for input(s): PROT, ALBUMIN, AST, ALT, ALKPHOS, BILITOT, BILIDIR, IBILI in the last 72 hours. Sedimentation Rate No results for input(s): ESRSEDRATE in the last 72 hours. C-Reactive Protein No results for input(s): CRP in the last 72 hours.  Microbiology: Recent Results (from the past 240 hour(s))  Culture, blood (routine x 2)     Status: None (Preliminary result)   Collection Time: 05/01/16 12:45 PM  Result Value Ref Range Status   Specimen Description BLOOD RIGHT HAND  Final   Special Requests IN PEDIATRIC BOTTLE 1 ML  Final   Culture  Setup Time   Final    GRAM POSITIVE COCCI IN CLUSTERS IN PEDIATRIC BOTTLE CRITICAL RESULT CALLED TO, READ BACK BY AND VERIFIED WITH: A  JOHNSTON,PHARMD AT 0808 05/02/16 BY L BENFIELD    Culture GRAM POSITIVE COCCI  Final   Report Status PENDING  Incomplete  Blood Culture ID Panel (Reflexed)     Status: Abnormal   Collection Time: 05/01/16 12:45 PM  Result Value Ref Range Status   Enterococcus species NOT DETECTED NOT DETECTED Final   Listeria monocytogenes NOT DETECTED NOT DETECTED Final   Staphylococcus species DETECTED (A) NOT DETECTED Final    Comment: CRITICAL RESULT CALLED TO, READ BACK BY AND VERIFIED WITH: A JOHNSTON,PHARMD AT 0808 05/02/16 BY L BENFIELD    Staphylococcus aureus DETECTED (A) NOT DETECTED Final    Comment: CRITICAL RESULT CALLED TO, READ BACK BY AND VERIFIED WITH: A JOHNSTON,PHARMD AT 0808 05/02/16 BY L BENFIELD    Methicillin resistance DETECTED (A) NOT DETECTED Final    Comment: CRITICAL RESULT CALLED TO, READ BACK BY AND VERIFIED WITH: A JOHNSTON,PHARMD AT 0808 05/02/16 BY L BENFIELD    Streptococcus species NOT DETECTED NOT DETECTED Final   Streptococcus agalactiae NOT DETECTED NOT DETECTED Final   Streptococcus pneumoniae NOT DETECTED NOT DETECTED Final   Streptococcus pyogenes NOT DETECTED NOT DETECTED Final   Acinetobacter baumannii NOT DETECTED NOT DETECTED Final   Enterobacteriaceae species NOT DETECTED NOT DETECTED Final   Enterobacter cloacae complex NOT DETECTED NOT DETECTED Final   Escherichia coli NOT DETECTED NOT DETECTED Final   Klebsiella oxytoca NOT DETECTED NOT DETECTED Final   Klebsiella pneumoniae NOT DETECTED NOT DETECTED Final   Proteus species NOT DETECTED NOT DETECTED Final   Serratia marcescens NOT DETECTED NOT DETECTED Final   Haemophilus influenzae NOT DETECTED NOT DETECTED Final   Neisseria meningitidis NOT DETECTED NOT DETECTED Final   Pseudomonas aeruginosa NOT DETECTED NOT DETECTED Final   Candida albicans NOT DETECTED NOT DETECTED Final   Candida glabrata NOT DETECTED NOT DETECTED Final   Candida krusei NOT DETECTED NOT DETECTED Final   Candida parapsilosis  NOT DETECTED NOT DETECTED Final   Candida tropicalis NOT DETECTED NOT DETECTED Final  Culture, blood (routine x 2)     Status: None (Preliminary result)   Collection Time: 05/01/16 12:51 PM  Result Value Ref Range Status   Specimen Description BLOOD RIGHT HAND  Final   Special Requests BOTTLES DRAWN AEROBIC AND ANAEROBIC 5 ML  Final   Culture NO GROWTH < 24 HOURS  Final   Report Status PENDING  Incomplete    Studies/Results: No results found.   Assessment/Plan: MRSA Bacteremia  Continue vancomycin- aim for 2 weeks.   Repeat BCx this AM pending  Repeat TTE result pending.  CHF             EF 30-35%, PA pressure 79             Pericardial effusion HTN             To be managed by primary Loop Recorder CVA CKD III             Cr improved to 1.16 yesterday Dementia Decubitus Ulcers Gaurdianship- family coming in today DNR Severe protein calorie malnutrition             Alb 1.4 (11-27)  Total days of antibiotics: 11 (ceftriaxone, vancomycin 12-1)         Johny SaxJeffrey Chrystian Ressler Infectious Diseases (pager) [phone] [email or web address] 05/03/2016, 10:46 AM  LOS: 10 days

## 2016-05-04 LAB — CULTURE, BLOOD (ROUTINE X 2)
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
## 2016-05-04 NOTE — Progress Notes (Signed)
Patient had no significant change on 7p to 7a shift. Patient took all meds and rested well. Patient reported no pain. Evangela Heffler Rn,BSN 05/04/2016 6:18 AM

## 2016-05-04 NOTE — Progress Notes (Signed)
Patient rested quietly during 7 a to 7 p shift, ate well, drank all her Ensure as well as juice, ate 100% of meals.

## 2016-05-05 ENCOUNTER — Inpatient Hospital Stay (HOSPITAL_COMMUNITY): Payer: Medicare Other

## 2016-05-05 DIAGNOSIS — R7881 Bacteremia: Secondary | ICD-10-CM

## 2016-05-05 DIAGNOSIS — Z22322 Carrier or suspected carrier of Methicillin resistant Staphylococcus aureus: Secondary | ICD-10-CM

## 2016-05-05 HISTORY — PX: IR GENERIC HISTORICAL: IMG1180011

## 2016-05-05 MED ORDER — ACETAMINOPHEN 325 MG PO TABS: 650.0000 mg | ORAL_TABLET | ORAL | Status: AC | PRN

## 2016-05-05 MED ORDER — HEPARIN SOD (PORK) LOCK FLUSH 100 UNIT/ML IV SOLN
INTRAVENOUS | Status: AC
  Filled 2016-05-05: qty 5

## 2016-05-05 MED ORDER — OXYCODONE HCL 5 MG PO TABS: 5.0000 mg | ORAL_TABLET | ORAL | 0 refills | Status: AC | PRN

## 2016-05-05 MED ORDER — LIDOCAINE HCL 1 % IJ SOLN
INTRAMUSCULAR | Status: AC
  Filled 2016-05-05: qty 20

## 2016-05-05 MED ORDER — CEFAZOLIN SODIUM-DEXTROSE 2-4 GM/100ML-% IV SOLN: 2.0000 g | Freq: Three times a day (TID) | INTRAVENOUS | Status: DC

## 2016-05-05 MED ORDER — CARVEDILOL 3.125 MG PO TABS: 3.1250 mg | ORAL_TABLET | Freq: Two times a day (BID) | ORAL | Status: AC

## 2016-05-05 MED ORDER — LIDOCAINE HCL 1 % IJ SOLN
INTRAMUSCULAR | Status: DC | PRN
  Administered 2016-05-05: 8 mL

## 2016-05-05 MED ORDER — FUROSEMIDE 20 MG PO TABS: 20.0000 mg | ORAL_TABLET | Freq: Every day | ORAL | 0 refills | Status: AC

## 2016-05-05 MED ORDER — ENSURE ENLIVE PO LIQD: 237.0000 mL | Freq: Two times a day (BID) | ORAL | 0 refills | Status: AC

## 2016-05-05 MED ORDER — CEFAZOLIN SODIUM-DEXTROSE 2-4 GM/100ML-% IV SOLN
2.0000 g | Freq: Three times a day (TID) | INTRAVENOUS | Status: DC
Start: 2016-05-05 — End: 2016-05-06
  Administered 2016-05-05 – 2016-05-06 (×4): 2 g via INTRAVENOUS
  Filled 2016-05-05 (×6): qty 100

## 2016-05-05 NOTE — Progress Notes (Signed)
INFECTIOUS DISEASE PROGRESS NOTE  ID: Katherine Chapman is a 82 y.o. female with  Principal Problem:   CHF (congestive heart failure) (HCC) Active Problems:   Essential hypertension   Chronic kidney disease, stage III (moderate)   Dementia   Pressure injury of skin   Hyperkalemia   Elevated troponin   Hypoalbuminemia   Malnutrition of moderate degree   Palliative care encounter   Goals of care, counseling/discussion  Subjective: Does not interact  Abtx:  Anti-infectives    Start     Dose/Rate Route Frequency Ordered Stop   05/03/16 0900  vancomycin (VANCOCIN) IVPB 750 mg/150 ml premix     750 mg 150 mL/hr over 60 Minutes Intravenous Every 24 hours 05/02/16 0904     05/02/16 0845  vancomycin (VANCOCIN) IVPB 1000 mg/200 mL premix     1,000 mg 200 mL/hr over 60 Minutes Intravenous  Once 05/02/16 0833 05/02/16 1139   04/24/16 0600  cefTRIAXone (ROCEPHIN) 2 g in dextrose 5 % 50 mL IVPB     2 g 100 mL/hr over 30 Minutes Intravenous Every 24 hours 04/23/16 2346 05/05/16 0643   04/23/16 2300  vancomycin (VANCOCIN) IVPB 1000 mg/200 mL premix  Status:  Discontinued     1,000 mg 200 mL/hr over 60 Minutes Intravenous Every 24 hours 04/22/16 2255 04/24/16 1321   04/23/16 1100  cefTAZidime (FORTAZ) 1 g in dextrose 5 % 50 mL IVPB  Status:  Discontinued     1 g 100 mL/hr over 30 Minutes Intravenous Every 12 hours 04/22/16 2255 04/23/16 2341   04/22/16 2330  cefTAZidime (FORTAZ) 2 g in dextrose 5 % 50 mL IVPB     2 g 100 mL/hr over 30 Minutes Intravenous  Once 04/22/16 2251 04/23/16 0025   04/22/16 2245  piperacillin-tazobactam (ZOSYN) IVPB 3.375 g  Status:  Discontinued     3.375 g 100 mL/hr over 30 Minutes Intravenous  Once 04/22/16 2239 04/22/16 2251   04/22/16 2245  vancomycin (VANCOCIN) IVPB 1000 mg/200 mL premix     1,000 mg 200 mL/hr over 60 Minutes Intravenous  Once 04/22/16 2239 04/23/16 0056      Medications:  Scheduled: . arformoterol  15 mcg Nebulization BID  . budesonide  (PULMICORT) nebulizer solution  0.5 mg Nebulization BID  . carvedilol  3.125 mg Oral BID WC  . collagenase   Topical Daily  . enoxaparin (LOVENOX) injection  30 mg Subcutaneous Q24H  . feeding supplement (ENSURE ENLIVE)  237 mL Oral BID BM  . feeding supplement (PRO-STAT SUGAR FREE 64)  30 mL Oral BID  . furosemide  20 mg Oral Daily  . multivitamin with minerals  1 tablet Oral Daily  . nitroGLYCERIN  0.5 inch Topical Q6H  . sodium chloride flush  3 mL Intravenous Q12H  . vancomycin  750 mg Intravenous Q24H    Objective: Vital signs in last 24 hours: Temp:  [97.6 F (36.4 C)-98.2 F (36.8 C)] 98.2 F (36.8 C) (12/04 0551) Pulse Rate:  [81-90] 81 (12/04 0803) Resp:  [18] 18 (12/04 0551) BP: (155-170)/(80-99) 157/87 (12/04 0803) SpO2:  [98 %-100 %] 100 % (12/04 0826) Weight:  [54.4 kg (120 lb)] 54.4 kg (120 lb) (12/04 0551)   General appearance: alert and no distress Resp: clear to auscultation bilaterally Cardio: regular rate and rhythm GI: normal findings: bowel sounds normal and soft, non-tender  Lab Results  Recent Labs  05/03/16 1408  WBC 6.4  HGB 9.4*  HCT 29.6*  NA 138  K 4.5  CL 101  CO2 29  BUN 42*  CREATININE 1.06*   Liver Panel No results for input(s): PROT, ALBUMIN, AST, ALT, ALKPHOS, BILITOT, BILIDIR, IBILI in the last 72 hours. Sedimentation Rate No results for input(s): ESRSEDRATE in the last 72 hours. C-Reactive Protein No results for input(s): CRP in the last 72 hours.  Microbiology: Recent Results (from the past 240 hour(s))  Culture, blood (routine x 2)     Status: Abnormal   Collection Time: 05/01/16 12:45 PM  Result Value Ref Range Status   Specimen Description BLOOD RIGHT HAND  Final   Special Requests IN PEDIATRIC BOTTLE 1 ML  Final   Culture  Setup Time   Final    GRAM POSITIVE COCCI IN CLUSTERS IN PEDIATRIC BOTTLE CRITICAL RESULT CALLED TO, READ BACK BY AND VERIFIED WITH: A JOHNSTON,PHARMD AT [id number] 05/02/16 BY L BENFIELD    Culture  STAPHYLOCOCCUS AUREUS (A)  Final   Report Status 05/04/2016 FINAL  Final   Organism ID, Bacteria STAPHYLOCOCCUS AUREUS  Final      Susceptibility   Staphylococcus aureus - MIC*    CIPROFLOXACIN <=0.5 SENSITIVE Sensitive     ERYTHROMYCIN <=0.25 SENSITIVE Sensitive     GENTAMICIN <=0.5 SENSITIVE Sensitive     OXACILLIN 0.5 SENSITIVE Sensitive     TETRACYCLINE <=1 SENSITIVE Sensitive     VANCOMYCIN 1 SENSITIVE Sensitive     TRIMETH/SULFA <=10 SENSITIVE Sensitive     CLINDAMYCIN <=0.25 SENSITIVE Sensitive     RIFAMPIN <=0.5 SENSITIVE Sensitive     Inducible Clindamycin NEGATIVE Sensitive     * STAPHYLOCOCCUS AUREUS  Blood Culture ID Panel (Reflexed)     Status: Abnormal   Collection Time: 05/01/16 12:45 PM  Result Value Ref Range Status   Enterococcus species NOT DETECTED NOT DETECTED Final   Listeria monocytogenes NOT DETECTED NOT DETECTED Final   Staphylococcus species DETECTED (A) NOT DETECTED Final    Comment: CRITICAL RESULT CALLED TO, READ BACK BY AND VERIFIED WITH: A JOHNSTON,PHARMD AT [id number] 05/02/16 BY L BENFIELD    Staphylococcus aureus DETECTED (A) NOT DETECTED Final    Comment: CRITICAL RESULT CALLED TO, READ BACK BY AND VERIFIED WITH: A JOHNSTON,PHARMD AT 0808 05/02/16 BY L BENFIELD    Methicillin resistance DETECTED (A) NOT DETECTED Final    Comment: CRITICAL RESULT CALLED TO, READ BACK BY AND VERIFIED WITH: A JOHNSTON,PHARMD AT 0808 05/02/16 BY L BENFIELD    Streptococcus species NOT DETECTED NOT DETECTED Final   Streptococcus agalactiae NOT DETECTED NOT DETECTED Final   Streptococcus pneumoniae NOT DETECTED NOT DETECTED Final   Streptococcus pyogenes NOT DETECTED NOT DETECTED Final   Acinetobacter baumannii NOT DETECTED NOT DETECTED Final   Enterobacteriaceae species NOT DETECTED NOT DETECTED Final   Enterobacter cloacae complex NOT DETECTED NOT DETECTED Final   Escherichia coli NOT DETECTED NOT DETECTED Final   Klebsiella oxytoca NOT DETECTED NOT DETECTED Final    Klebsiella pneumoniae NOT DETECTED NOT DETECTED Final   Proteus species NOT DETECTED NOT DETECTED Final   Serratia marcescens NOT DETECTED NOT DETECTED Final   Haemophilus influenzae NOT DETECTED NOT DETECTED Final   Neisseria meningitidis NOT DETECTED NOT DETECTED Final   Pseudomonas aeruginosa NOT DETECTED NOT DETECTED Final   Candida albicans NOT DETECTED NOT DETECTED Final   Candida glabrata NOT DETECTED NOT DETECTED Final   Candida krusei NOT DETECTED NOT DETECTED Final   Candida parapsilosis NOT DETECTED NOT DETECTED Final   Candida tropicalis NOT DETECTED NOT DETECTED Final  Culture, blood (routine x 2)  Status: None (Preliminary result)   Collection Time: 05/01/16 12:51 PM  Result Value Ref Range Status   Specimen Description BLOOD RIGHT HAND  Final   Special Requests BOTTLES DRAWN AEROBIC AND ANAEROBIC 5 ML  Final   Culture NO GROWTH 3 DAYS  Final   Report Status PENDING  Incomplete  Culture, blood (routine x 2)     Status: None (Preliminary result)   Collection Time: 05/03/16  8:10 AM  Result Value Ref Range Status   Specimen Description BLOOD BLOOD RIGHT ARM  Final   Special Requests BOTTLES DRAWN AEROBIC AND ANAEROBIC 5CC  Final   Culture NO GROWTH 1 DAY  Final   Report Status PENDING  Incomplete  Culture, blood (routine x 2)     Status: None (Preliminary result)   Collection Time: 05/03/16  8:25 AM  Result Value Ref Range Status   Specimen Description BLOOD BLOOD RIGHT HAND  Final   Special Requests IN PEDIATRIC BOTTLE 4CC  Final   Culture NO GROWTH 1 DAY  Final   Report Status PENDING  Incomplete    Studies/Results: No results found.   Assessment/Plan: MSSA Bacteremia             Vancomycin--> ancef  aim for 2 weeks. End date 12-13  Will assume that the methicillin resistance comes from the CoAgNeg staph that did not grow in Cx  and is a contaminant             Repeat BCx ngtd             Repeat TTE- no vegetation noted  Available as needed.    CHF EF 30-35%, PA pressure 79 Pericardial effusion HTN To be managed by primary Loop Recorder CVA CKD III Cr improved to 1.06  Dementia Decubitus Ulcers Gaurdianship- family coming in today DNR Severe protein calorie malnutrition Alb 1.4 (11-27)  Total days of antibiotics: 13 (ceftriaxone, vancomycin 12-1)         Johny Sax Infectious Diseases (pager) [phone] [email or web address] 05/05/2016, 10:54 AM  LOS: 12 days

## 2016-05-05 NOTE — Progress Notes (Signed)
Pharmacy Antibiotic Note  Katherine Chapman is a 82 y.o. female found to have bacteremia.   Day #4 of vancomycin for possible MRSA bacteremia? BCID showed either MRSA or MRSE and MSSA while blood cx shows MSSA. Discussed with ID. Most likely was MSSA with a CoNS contaminant in culture. Would be unlikely but could also be MRSA with the mec A gene but it is just not expressed. Also s/p ceftriaxone day #12 for proteus bacteremia. Afebrile, WBC wnl. Scr 1.06, CrCl ~8335ml/min.   Plan: Stop Rocephin and vancomycin Start cefazolin 2g IV Q8 Monitor clinical picture, renal function  Height: 5' 6.93" (170 cm) Weight: 120 lb (54.4 kg) IBW/kg (Calculated) : 61.44  Temp (24hrs), Avg:97.9 F (36.6 C), Min:97.6 F (36.4 C), Max:98.2 F (36.8 C)   Recent Labs Lab 04/30/16 0519 05/01/16 0059 05/02/16 0353 05/03/16 1408  WBC 7.3 6.1 6.4 6.4  CREATININE  --  1.27* 1.16* 1.06*    Estimated Creatinine Clearance: 34.5 mL/min (by C-G formula based on SCr of 1.06 mg/dL (H)).    Allergies  Allergen Reactions  . Penicillins Hives    Antimicrobials this admission: 11/21 Vancomycin > 11/23; 12/1 >> 12/4 11/21 Ceftazidime > 11/22 11/23 Rocephin >> 12/4 11/24 Ancef >>  Dose adjustments this admission: n/a  Microbiology results: 11/21 blood cx: Proteus 11/21 urine cx: e.coli 11/30 blood cx: 1/2 MSSA 12/2 Blood Cx: ngtd  Thank you for allowing pharmacy to be a part of this patient's care.  Enzo BiNathan Revanth Neidig, PharmD, BCPS Clinical Pharmacist Pager [phone] 05/05/2016 11:21 AM
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
## 2016-05-05 NOTE — Progress Notes (Signed)
Pt was referred to Interventional Radiology due to contractures of both arms.  Unable to position onto back due to severe leg contractures.   Primary RN made aware.
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
## 2016-05-06 ENCOUNTER — Encounter (HOSPITAL_COMMUNITY): Payer: Self-pay | Admitting: Interventional Radiology

## 2016-05-06 LAB — CULTURE, BLOOD (ROUTINE X 2): Culture: NO GROWTH

## 2016-05-06 MED ORDER — CEFAZOLIN IV (FOR PTA / DISCHARGE USE ONLY): 2.0000 g | Freq: Three times a day (TID) | INTRAVENOUS | 0 refills | Status: AC

## 2016-05-06 NOTE — Clinical Social Work Note (Signed)
CSW facilitated patient discharge including contacting patient family and facility to confirm patient discharge plans. Clinical information faxed to facility and family agreeable with plan. CSW arranged ambulance transport via [facility] to [facility] at 4:00 pm. RN to call report prior to discharge [phone]).  CSW will sign off for now as social work intervention is no longer needed. Please consult us again if new needs arise.  Charlynn CourtSarah Shernita Rabinovich, CSW [phone]

## 2016-05-06 NOTE — Progress Notes (Signed)
Report called to kristy at [facility].  Pt's family has been made aware of transport to take place at 1600 today.

## 2016-05-06 NOTE — Clinical Social Work Note (Signed)
CSW spoke with MD regarding concerns about IV ancef shortage at pharmacy. She stated that oral Ancef is not an option due to patient having bacteremia. If pharmacy runs out prior to 12/13, she can switch to Oxycillin. This medication, per MD is more expensive but her staph will be sensitive to it.  SNF notified and will call pharmacy to find out cost.  Charlynn CourtSarah Brigid Vandekamp, CSW [phone]

## 2016-05-06 NOTE — Clinical Social Work Placement (Signed)
   CLINICAL SOCIAL WORK PLACEMENT  NOTE  Date:  05/06/2016  Patient Details  Name: Katherine Chapman MRN: [id number] Date of Birth: 12/16/1930  Clinical Social Work is seeking post-discharge placement for this patient at the Skilled  Nursing Facility level of care (*CSW will initial, date and re-position this form in  chart as items are completed):  Yes   Patient/family provided with [facility] Social Work Department's list of facilities offering this level of care within the geographic area requested by the patient (or if unable, by the patient's family).  Yes   Patient/family informed of their freedom to choose among providers that offer the needed level of care, that participate in Medicare, Medicaid or managed care program needed by the patient, have an available bed and are willing to accept the patient.  Yes   Patient/family informed of [facility]'s ownership interest in [facility] and [facility], as well as of the fact that they are under no obligation to receive care at these facilities.  PASRR submitted to EDS on 04/29/16     PASRR number received on 04/29/16     Existing PASRR number confirmed on       FL2 transmitted to all facilities in geographic area requested by pt/family on 04/29/16     FL2 transmitted to all facilities within larger geographic area on       Patient informed that his/her managed care company has contracts with or will negotiate with certain facilities, including the following:        Yes   Patient/family informed of bed offers received.  [facility]     Physician recommends and patient chooses bed at      [facility].  Patient to be transferred to facility by [facility]     Patient family notified on 05/06/16 of transfer.  Name of family member notified:  Nira RetortMary Manuel (daughter)     PHYSICIAN Please prepare prescriptions     Additional Comment:     _______________________________________________ Margarito LinerSarah C Brecklyn Galvis, LCSW 05/06/2016, 1:46 PM

## 2016-05-08 LAB — CULTURE, BLOOD (ROUTINE X 2)
CULTURE: NO GROWTH
Culture: NO GROWTH

## 2016-07-03 DEATH — deceased

## 2017-08-21 IMAGING — XA DG FLUORO GUIDE CV LINE
1 series · 1 of 1 positions shown · non-contrast
Comparison: none

CLINICAL DATA: Bacteremia, needs IV access for antibiotic regimen

EXAM:
PICC PLACEMENT WITH ULTRASOUND AND FLUOROSCOPY
FLUOROSCOPY TIME:  1.1 minute (100 uMym2 DAP)
TECHNIQUE: After written informed consent was obtained, patient was placed in
the supine position on angiographic table. Patency of the right
brachial vein was confirmed with ultrasound with image
documentation. An appropriate skin site was determined. Skin site
was marked. Region was prepped using maximum barrier technique
including cap and mask, sterile gown, sterile gloves, large sterile
sheet, and Chlorhexidine as cutaneous antisepsis. The region was
infiltrated locally with 1% lidocaine. Under real-time ultrasound
guidance, the right brachial vein was accessed with a 21 gauge
micropuncture needle; the needle tip within the vein was confirmed
with ultrasound image documentation. Needle exchanged over a 018
guidewire for a peel-away sheath, through which a 5-French
single-lumen power injectable PICC trimmed to 40cm was advanced,
positioned with its tip near the cavoatrial junction. Spot chest
radiograph confirms appropriate catheter position. Catheter was
flushed per protocol and secured externally. The patient tolerated
procedure well.
COMPLICATIONS:
COMPLICATIONS
none

[Series 1: fl (-) angio · 1 of 1 slices shown]
[im 1/1]
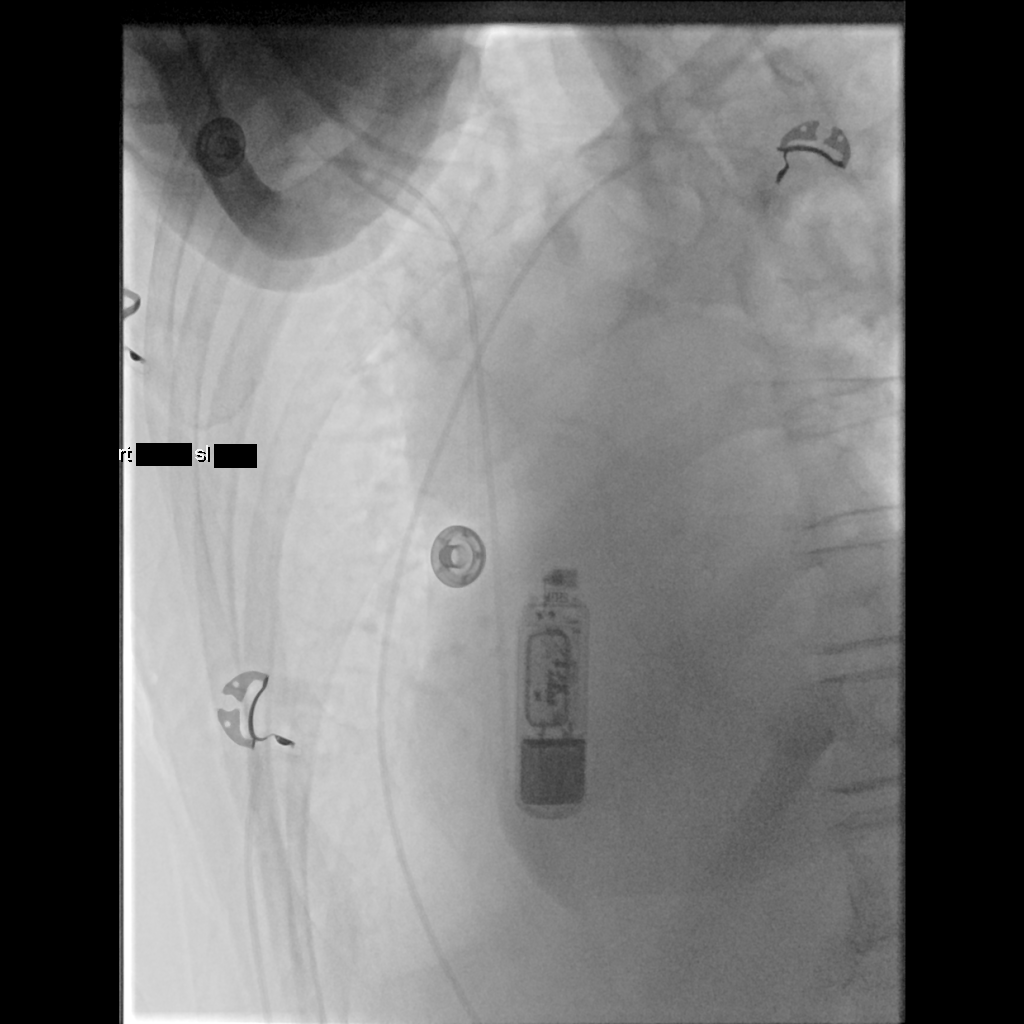

[1 of 1 positions shown; findings below may reference images not displayed]

IMPRESSION: 1. Technically successful five French single lumen power injectable
PICC placement
